# Patient Record
Sex: Male | Born: 1965 | Race: White | Hispanic: No | Marital: Married | Smoking: Never smoker
Health system: Southern US, Community
[De-identification: ages and names within clinical notes are randomized; demographics above are authoritative.]

## PROBLEM LIST (undated history)

## (undated) DIAGNOSIS — Z889 Allergy status to unspecified drugs, medicaments and biological substances status: Secondary | ICD-10-CM

## (undated) DIAGNOSIS — M109 Gout, unspecified: Secondary | ICD-10-CM

## (undated) DIAGNOSIS — K219 Gastro-esophageal reflux disease without esophagitis: Secondary | ICD-10-CM

## (undated) DIAGNOSIS — K5790 Diverticulosis of intestine, part unspecified, without perforation or abscess without bleeding: Secondary | ICD-10-CM

## (undated) DIAGNOSIS — M1990 Unspecified osteoarthritis, unspecified site: Secondary | ICD-10-CM

## (undated) DIAGNOSIS — J45909 Unspecified asthma, uncomplicated: Secondary | ICD-10-CM

## (undated) HISTORY — DX: Allergy status to unspecified drugs, medicaments and biological substances: Z88.9

## (undated) HISTORY — PX: KNEE SURGERY: SHX244

## (undated) HISTORY — DX: Gastro-esophageal reflux disease without esophagitis: K21.9

## (undated) HISTORY — DX: Diverticulosis of intestine, part unspecified, without perforation or abscess without bleeding: K57.90

## (undated) HISTORY — PX: SHOULDER SURGERY: SHX246

## (undated) HISTORY — DX: Gout, unspecified: M10.9

## (undated) HISTORY — PX: APPENDECTOMY: SHX54

## (undated) HISTORY — DX: Unspecified osteoarthritis, unspecified site: M19.90

## (undated) HISTORY — DX: Unspecified asthma, uncomplicated: J45.909

---

## 2005-07-09 ENCOUNTER — Ambulatory Visit: Payer: Self-pay | Admitting: Psychiatry

## 2006-05-27 ENCOUNTER — Ambulatory Visit (HOSPITAL_COMMUNITY): Payer: Self-pay | Admitting: Psychiatry

## 2014-07-10 ENCOUNTER — Encounter: Payer: Self-pay | Admitting: Physician Assistant

## 2014-07-25 ENCOUNTER — Encounter: Payer: Self-pay | Admitting: *Deleted

## 2014-07-26 ENCOUNTER — Ambulatory Visit (INDEPENDENT_AMBULATORY_CARE_PROVIDER_SITE_OTHER): Payer: PRIVATE HEALTH INSURANCE | Admitting: Physician Assistant

## 2014-07-26 ENCOUNTER — Other Ambulatory Visit (INDEPENDENT_AMBULATORY_CARE_PROVIDER_SITE_OTHER): Payer: PRIVATE HEALTH INSURANCE

## 2014-07-26 ENCOUNTER — Encounter: Payer: Self-pay | Admitting: Physician Assistant

## 2014-07-26 VITALS — BP 120/78 | HR 60 | Ht 67.0 in | Wt 208.4 lb

## 2014-07-26 DIAGNOSIS — R1314 Dysphagia, pharyngoesophageal phase: Secondary | ICD-10-CM

## 2014-07-26 DIAGNOSIS — Z8719 Personal history of other diseases of the digestive system: Secondary | ICD-10-CM

## 2014-07-26 DIAGNOSIS — Z8379 Family history of other diseases of the digestive system: Secondary | ICD-10-CM

## 2014-07-26 LAB — LIPID PANEL
CHOL/HDL RATIO: 4
CHOLESTEROL: 166 mg/dL (ref 0–200)
HDL: 37.6 mg/dL — ABNORMAL LOW (ref >39.00)
LDL Cholesterol: 103 mg/dL — ABNORMAL HIGH (ref 0–99)
NonHDL: 128.4
Triglycerides: 128 mg/dL (ref 0.0–149.0)
VLDL: 25.6 mg/dL (ref 0.0–40.0)

## 2014-07-26 MED ORDER — PANTOPRAZOLE SODIUM 40 MG PO TBEC: 40.0000 mg | DELAYED_RELEASE_TABLET | Freq: Every day | ORAL | Status: DC

## 2014-07-26 NOTE — Patient Instructions (Signed)
Please go to the basement level to have your labs drawn.  You have been scheduled for an endoscopy. Please follow written instructions given to you at your visit today. If you use inhalers (even only as needed), please bring them with you on the day of your procedure.

## 2014-07-26 NOTE — Progress Notes (Addendum)
Patient ID: Wesley Cordova, male   DOB: [birth year], 49 y.o.   MRN: [id number]   Subjective:    Patient ID: Wesley Cordova, male    DOB: September 11, 1965, 49 y.o.   MRN: [id number]  HPI Wesley Cordova is a pleasant 49 -year-old white male new to GI today referred by [facility] for evaluation of dysphagia. Patient has history of an esophageal stricture and states that he had 2 prior dilations done per Dr. Algis Greenhouse in [place]. We do have some of those records and it appears he had last EGD done in 2011 with finding of a distal esophageal stricture with his active erosions biopsies were done and he was dilated to #54. Path from those biopsies showed epithelial hyperplasia and mild chronic inflammation of the esophagus. Patient also had EGD in 2007 with dilation. He had upper abdominal ultrasound done in March 2011 which was negative with the exception of a nonobstructing left renal calculus. He also states that he had a prior colonoscopy. Plan 2003 year 2000 for by a Dr. Posey Pronto in [place] was found to have diverticulosis. Patient says that he has been having trouble again for the past couple of months with recurrent episodes of solid food dysphagia. This seems to be worse with meats. He has had episodes once or twice weekly with food hanging in his esophagus. He says this is very uncomfortable he has to stop eating get up and walk around and sometimes has his wife attempt the Heimlich maneuver. He has rarely had to regurgitate generally food will gradually pass. He is episodes he feels fine. He says he has never had any problems with heartburn or indigestion. Implants of abdominal discomfort. No changes in his bowel habits. His wife is quite concerned as he has a strong history family history of liver disease and fatty liver. Patient does not believe he is ever been tested for hepatitis C or had a lipid panel. He does not consume alcohol on a regular basis. Again upper abdominal ultrasound from 2011 was  read as a normal-appearing liver. Patient has not ever take an acid blockers as he has not been bothered by heartburn or indigestion.  Review of Systems Pertinent positive and negative review of systems were noted in the above HPI section.  All other review of systems was otherwise negative.  Outpatient Encounter Prescriptions as of 07/26/2014  Medication Sig  . colchicine 0.6 MG tablet Take 0.6 mg by mouth.  . sertraline (ZOLOFT) 100 MG tablet Take 100 mg by mouth daily.  . pantoprazole (PROTONIX) 40 MG tablet Take 1 tablet (40 mg total) by mouth daily.   No Known Allergies Patient Active Problem List   Diagnosis Date Noted  . History of esophageal stricture 07/26/2014   History   Social History  . Marital Status: Single    Spouse Name: N/A  . Number of Children: 1  . Years of Education: N/A   Occupational History  . Electrician    Social History Main Topics  . Smoking status: Never Smoker   . Smokeless tobacco: Never Used  . Alcohol Use: No     Comment: Social bases only  . Drug Use: No  . Sexual Activity: Not on file   Other Topics Concern  . Not on file   Social History Narrative    Mr. Wesley Cordova's family history includes Colon polyps in his mother; Diabetes in his mother; Diabetes Mellitus II in his mother; Heart disease in his father; Hypertension in his father; Liver disease  in his mother; Prostate cancer in his maternal uncle.      Objective:    Filed Vitals:   07/26/14 1015  BP: 120/78  Pulse: 60    Physical Exam  well-developed white male in no acute distress, pleasant accompanied by his wife blood pressure 120/78 pulse 60 height 5 foot 7 weight 208. HEENT; nontraumatic normocephalic EOMI PERRLA sclera anicteric, Supple; no JVD, Cardiovascular; regular rate and rhythm with S1-S2 no murmur rub or gallop, Pulmonary; clear bilaterally, Abdomen; soft nontender nondistended there is no palpable mass or hepatosplenomegaly BS sounds are present, Rectal; exam not  done, Extremities; no clubbing cyanosis or edema several tattoos, Psych ;mood and affect appropriate       Assessment & Plan:  #1 49 yo male with hx of esophageal stricture s/p previous dilations-last in 2011 . 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 Hilarie Fredrickson . Procedure discussed in detail with pt and he is agreeable to proceed Check lipid panel and hep C  AB for screening  [facility]-Dr.John Hungerland   Amy Genia Harold PA-C 07/26/2014  Addendum: Reviewed and agree with initial management. Jerene Bears, MD

## 2014-07-27 LAB — HEPATITIS C ANTIBODY: HCV Ab: NEGATIVE

## 2014-08-15 ENCOUNTER — Encounter: Payer: PRIVATE HEALTH INSURANCE | Admitting: Internal Medicine

## 2014-09-10 ENCOUNTER — Encounter: Payer: PRIVATE HEALTH INSURANCE | Admitting: Internal Medicine

## 2018-06-22 ENCOUNTER — Encounter: Payer: Self-pay | Admitting: Family Medicine

## 2018-12-18 ENCOUNTER — Ambulatory Visit (INDEPENDENT_AMBULATORY_CARE_PROVIDER_SITE_OTHER): Payer: BC Managed Care – PPO | Admitting: Internal Medicine

## 2018-12-21 ENCOUNTER — Ambulatory Visit (INDEPENDENT_AMBULATORY_CARE_PROVIDER_SITE_OTHER): Payer: BC Managed Care – PPO | Admitting: Internal Medicine

## 2018-12-21 ENCOUNTER — Other Ambulatory Visit: Payer: Self-pay

## 2018-12-21 ENCOUNTER — Telehealth (INDEPENDENT_AMBULATORY_CARE_PROVIDER_SITE_OTHER): Payer: Self-pay | Admitting: *Deleted

## 2018-12-21 ENCOUNTER — Encounter (INDEPENDENT_AMBULATORY_CARE_PROVIDER_SITE_OTHER): Payer: Self-pay | Admitting: *Deleted

## 2018-12-21 ENCOUNTER — Encounter (INDEPENDENT_AMBULATORY_CARE_PROVIDER_SITE_OTHER): Payer: Self-pay | Admitting: Internal Medicine

## 2018-12-21 VITALS — BP 126/76 | HR 53 | Temp 98.1°F | Resp 18 | Ht 68.0 in | Wt 201.0 lb

## 2018-12-21 DIAGNOSIS — Z1211 Encounter for screening for malignant neoplasm of colon: Secondary | ICD-10-CM

## 2018-12-21 DIAGNOSIS — K5732 Diverticulitis of large intestine without perforation or abscess without bleeding: Secondary | ICD-10-CM | POA: Insufficient documentation

## 2018-12-21 MED ORDER — PLENVU 140 G PO SOLR: 1.0000 | Freq: Once | ORAL | 0 refills | Status: AC

## 2018-12-21 NOTE — Telephone Encounter (Signed)
Patient needs plenvu 

## 2018-12-21 NOTE — Progress Notes (Addendum)
Subjective:    Patient ID: Wesley Cordova, male    DOB: 29-Dec-1965, 53 y.o.   MRN: [id number]  HPI  Wesley Cordova is a 53 y.o. male with a history of dysphagia, esophageal stricture and diverticulitis. He presents today for further evaluation for recurrent diverticulitis. He awakened at 4am with nausea, vomiting and generalized lower abdominal pain with diarrhea approximately 3 weeks ago. He had burning chest pain after vomiting. He went to [facility] ED in [place]. He received Protonix and his chest pain abated. Cardiac evaluation was negative. An abdominal/pelvic CT confirmed diverticulitis per the patient's report. He stated having right and left lower abdominal pain at that time, R > L. He was discharged home on Cipro 500mg  po bid an Flagyl 10mg  po bid x 10 days. He was not given a prescription for these medications as he had a current supply at home. His abdominal pain completely resolved after the 3rd day on antibiotics. No further diarrhea. No constipation. He typically passes a normal solid stool once daily. No rectal bleeding or melena. He reports having approximately 12 episodes of diverticulitis over the past 7 to 8 years. He was admitted to the hospital in [place], [place] for 3 days with diverticulitis 12/2017, no abscess reported. He received IV antibiotics for 3 days then discharged home on po Cipro and Flagyl and his pain completely resolved. He's never seen a surgeon to evaluate his recurrent diverticulitis. He took Protonix 40mg  po once daily for 2 weeks following his [facility] ED evaluation 10/2018 without further heartburn or chest pain. No dysphagia since his esophagus was dilated in 2011. He infrequently takes Ibuprofen for aches and pains, last took  Ibuprofen 200mg  4 tabs for a headache 1 month ago. He drinks 1 or 2 beers weekly. Mother with history of colon polyps and NASH cirrhosis which required a liver transplant 3 years ago.  EGD 08/21/2009 by Dr. Nathaneil Canary Shiflett: Peptic  stricture with active erosions distally at 40cm. Stomach, pyloric channel and duodenum appeared normal. The esophagus was dilated with a 54 Bougie x 3 to fully dilate the esophagus.  Path from those biopsies showed epithelial hyperplasia and mild chronic inflammation of the esophagus.   EGD in 2007 with esophageal dilation  2003 or 2004:Colonoscopy by Dr. Posey Pronto in [place], [place]:  Diverticulosis  Past Medical History:  Diagnosis Date  . Arthritis   . Asthma   . Diverticulosis   . GERD (gastroesophageal reflux disease)   . Gout   . Multiple allergies    Past Surgical History:  Procedure Laterality Date  . APPENDECTOMY    . KNEE SURGERY    . SHOULDER SURGERY      Review of Systems: See HPI, all other systems reviewed and are negative.   Current Outpatient Medications on File Prior to Visit  Medication Sig Dispense Refill  . sertraline (ZOLOFT) 100 MG tablet Take 100 mg by mouth daily.     No current facility-administered medications on file prior to visit.       Objective:   Physical Exam BP 126/76   Pulse (!) 53   Temp 98.1 F (36.7 C)   Resp 18   Ht 5\' 8"  (1.727 m)   Wt 201 lb (91.2 kg)   BMI 30.56 kg/m   Head: normocephalic Eyes: sclera nonicteric, conjunctiva pink Neck: supple, no thyromegaly, no lymphadenopathy Heart: S1,S2, RRR, no murmurs Lungs: clear throughout lung fields. Abdomen: soft, nontender, no masses or organomegaly.  Extremities: no edema or cyanosis. Skin:  multiple tattoos Neuro: alert and oriented x 4, no focal deficits     Assessment & Plan:   1. 53 y.o. male with recurrent diverticulitis -request abd/pelvic CT report and lab results from [facility] 10/2018 -schedule a colonoscopy in 8 to 10 weeks, benefits and risks discussed including risk with sedation, risk of bleeding and perforation -patient to call office if abdominal pain recurs -avoid constipation discussed  2. Family history of colon polyps  3. Family history of NASH -avoid  weight gain, discussed exercise, healthy diet  4. History of dysphagia secondary to esophageal stricture, no further dysphagia since esophageal dilatation  In 2011 -patient will call office if dysphagia or heartburn recurs  Addendum 12/26/2018: Patient's records from [facility] received. An abdominal/pelvic CT with IV contrast 11/14/2018 identified diverticulosis WITHOUT EVIDENCE OF DIVERTICULITIS. Reports to be scanned into chart.  WBC 10.8. He was given Protonix and Reglan for his chest pain. Patient should proceed with a colonoscopy as scheduled as his last colonoscopy was ? In 2004.  [place]

## 2018-12-21 NOTE — Patient Instructions (Signed)
1. Schedule a colonoscopy in 8 to 10 weeks  2. Call our office if your abdominal pain recurs prior to your colonoscopy  3. Further follow up to be determined after colonoscopy completed

## 2019-02-20 ENCOUNTER — Other Ambulatory Visit (HOSPITAL_COMMUNITY)
Admission: RE | Admit: 2019-02-20 | Discharge: 2019-02-20 | Disposition: A | Discharge status: Home / Self Care | Payer: BC Managed Care – PPO | Source: Ambulatory Visit | Attending: Internal Medicine | Admitting: Internal Medicine

## 2019-02-20 DIAGNOSIS — Z20828 Contact with and (suspected) exposure to other viral communicable diseases: Secondary | ICD-10-CM | POA: Diagnosis not present

## 2019-02-20 DIAGNOSIS — Z01812 Encounter for preprocedural laboratory examination: Secondary | ICD-10-CM | POA: Diagnosis present

## 2019-02-20 LAB — SARS CORONAVIRUS 2 (TAT 6-24 HRS): SARS Coronavirus 2: NEGATIVE

## 2019-02-22 ENCOUNTER — Encounter (HOSPITAL_COMMUNITY): Payer: Self-pay | Admitting: *Deleted

## 2019-02-22 ENCOUNTER — Other Ambulatory Visit: Payer: Self-pay

## 2019-02-22 ENCOUNTER — Ambulatory Visit (HOSPITAL_COMMUNITY)
Admission: RE | Admit: 2019-02-22 | Discharge: 2019-02-22 | Disposition: A | Discharge status: Home / Self Care | Payer: BC Managed Care – PPO | Attending: Internal Medicine | Admitting: Internal Medicine

## 2019-02-22 ENCOUNTER — Encounter (HOSPITAL_COMMUNITY)
Admission: RE | Disposition: A | Discharge status: Home / Self Care | Payer: Self-pay | Source: Home / Self Care | Attending: Internal Medicine

## 2019-02-22 DIAGNOSIS — D120 Benign neoplasm of cecum: Secondary | ICD-10-CM | POA: Diagnosis not present

## 2019-02-22 DIAGNOSIS — K644 Residual hemorrhoidal skin tags: Secondary | ICD-10-CM | POA: Diagnosis not present

## 2019-02-22 DIAGNOSIS — D122 Benign neoplasm of ascending colon: Secondary | ICD-10-CM | POA: Diagnosis not present

## 2019-02-22 DIAGNOSIS — Z1211 Encounter for screening for malignant neoplasm of colon: Secondary | ICD-10-CM | POA: Insufficient documentation

## 2019-02-22 DIAGNOSIS — M1990 Unspecified osteoarthritis, unspecified site: Secondary | ICD-10-CM | POA: Diagnosis not present

## 2019-02-22 DIAGNOSIS — D123 Benign neoplasm of transverse colon: Secondary | ICD-10-CM

## 2019-02-22 DIAGNOSIS — K5730 Diverticulosis of large intestine without perforation or abscess without bleeding: Secondary | ICD-10-CM | POA: Insufficient documentation

## 2019-02-22 DIAGNOSIS — Z79899 Other long term (current) drug therapy: Secondary | ICD-10-CM | POA: Diagnosis not present

## 2019-02-22 DIAGNOSIS — K6289 Other specified diseases of anus and rectum: Secondary | ICD-10-CM | POA: Insufficient documentation

## 2019-02-22 DIAGNOSIS — J45909 Unspecified asthma, uncomplicated: Secondary | ICD-10-CM | POA: Diagnosis not present

## 2019-02-22 DIAGNOSIS — K5732 Diverticulitis of large intestine without perforation or abscess without bleeding: Secondary | ICD-10-CM | POA: Insufficient documentation

## 2019-02-22 HISTORY — PX: POLYPECTOMY: SHX5525

## 2019-02-22 HISTORY — PX: COLONOSCOPY: SHX5424

## 2019-02-22 SURGERY — COLONOSCOPY
Anesthesia: Moderate Sedation

## 2019-02-22 MED ORDER — MIDAZOLAM HCL 5 MG/5ML IJ SOLN
INTRAMUSCULAR | Status: AC
  Filled 2019-02-22: qty 10

## 2019-02-22 MED ORDER — MEPERIDINE HCL 50 MG/ML IJ SOLN
INTRAMUSCULAR | Status: DC | PRN
  Administered 2019-02-22 (×3): 25 mg via INTRAVENOUS

## 2019-02-22 MED ORDER — MEPERIDINE HCL 50 MG/ML IJ SOLN
INTRAMUSCULAR | Status: AC
  Filled 2019-02-22: qty 1

## 2019-02-22 MED ORDER — STERILE WATER FOR IRRIGATION IR SOLN
Status: DC | PRN
  Administered 2019-02-22: 2.5 mL

## 2019-02-22 MED ORDER — SODIUM CHLORIDE 0.9 % IV SOLN
INTRAVENOUS | Status: DC
  Administered 2019-02-22: 10:00:00 via INTRAVENOUS

## 2019-02-22 MED ORDER — MIDAZOLAM HCL 5 MG/5ML IJ SOLN
INTRAMUSCULAR | Status: DC | PRN
  Administered 2019-02-22 (×4): 2 mg via INTRAVENOUS
  Administered 2019-02-22: 1 mg via INTRAVENOUS

## 2019-02-22 NOTE — Op Note (Signed)
[facility] Patient Name: Wesley Cordova Procedure Date: 02/22/2019 10:00 AM MRN: [id number] Date of Birth: [birth year] Attending MD: Hildred Laser , MD CSN: [id number] Age: 53 Admit Type: Outpatient Procedure:                Colonoscopy Indications:              Screening for colorectal malignant neoplasm Providers:                Hildred Laser, MD, Otis Peak B. Sharon Seller, RN, Aram Candela Referring MD:             Yvone Neu, MD Medicines:                Meperidine 75 mg IV, Midazolam 9 mg IV Complications:            No immediate complications. Estimated Blood Loss:     Estimated blood loss was minimal. Procedure:                Pre-Anesthesia Assessment:                           - Prior to the procedure, a History and Physical                            was performed, and patient medications and                            allergies were reviewed. The patient's tolerance of                            previous anesthesia was also reviewed. The risks                            and benefits of the procedure and the sedation                            options and risks were discussed with the patient.                            All questions were answered, and informed consent                            was obtained. Prior Anticoagulants: The patient has                            taken no previous anticoagulant or antiplatelet                            agents except for NSAID medication. ASA Grade                            Assessment: II - A patient with mild systemic  disease. After reviewing the risks and benefits,                            the patient was deemed in satisfactory condition to                            undergo the procedure.                           After obtaining informed consent, the colonoscope                            was passed under direct vision. Throughout the   procedure, the patient's blood pressure, pulse, and                            oxygen saturations were monitored continuously. The                            [id number] scope was introduced through                            the anus and advanced to the the cecum, identified                            by appendiceal orifice and ileocecal valve. The                            colonoscopy was performed without difficulty. The                            patient tolerated the procedure well. The quality                            of the bowel preparation was good. The ileocecal                            valve, appendiceal orifice, and rectum were                            photographed. Scope In: 10:36:03 AM Scope Out: 11:05:25 AM Scope Withdrawal Time: 0 hours 23 minutes 16 seconds  Total Procedure Duration: 0 hours 29 minutes 22 seconds  Findings:      The perianal and digital rectal examinations were normal.      A 4 to 7 mm polyp was found in the cecum. The polyp was flat. The polyp       was removed with a cold snare. Resection and retrieval were complete.       The pathology specimen was placed into Bottle Number 1.      Three polyps were found in the ascending colon. The polyps were small in       size. These were biopsied with a cold forceps for histology. The       pathology specimen was placed into Bottle Number 1.      Scattered small and large-mouthed  diverticula were found in the sigmoid       colon.      External hemorrhoids were found during retroflexion. The hemorrhoids       were small.      Anal papilla(e) were hypertrophied. Impression:               - One 4 to 7 mm polyp in the cecum, removed with a                            cold snare. Resected and retrieved.                           - Three small polyps in the ascending colon.                            Biopsied.                           - Diverticulosis in the sigmoid colon.                           -  External hemorrhoids.                           - Anal papilla(e) were hypertrophied. Moderate Sedation:      Moderate (conscious) sedation was administered by the endoscopy nurse       and supervised by the endoscopist. The following parameters were       monitored: oxygen saturation, heart rate, blood pressure, CO2       capnography and response to care. Total physician intraservice time was       38 minutes. Recommendation:           - Patient has a contact number available for                            emergencies. The signs and symptoms of potential                            delayed complications were discussed with the                            patient. Return to normal activities tomorrow.                            Written discharge instructions were provided to the                            patient.                           - High fiber diet today.                           - Continue present medications.                           - No aspirin, ibuprofen, naproxen,  or other                            non-steroidal anti-inflammatory drugs for 1 day.                           - Await pathology results.                           - Repeat colonoscopy is recommended for                            surveillance. The colonoscopy date will be                            determined after pathology results from today's                            exam become available for review. Procedure Code(s):        --- Professional ---                           [phone], Colonoscopy, flexible; with removal of                            tumor(s), polyp(s), or other lesion(s) by snare                            technique                           [id number], 59, Colonoscopy, flexible; with biopsy,                            single or multiple                           99153, Moderate sedation; each additional 15                            minutes intraservice time                           99153,  Moderate sedation; each additional 15                            minutes intraservice time                           G0500, Moderate sedation services provided by the                            same physician or other qualified health care                            professional performing a gastrointestinal  endoscopic service that sedation supports,                            requiring the presence of an independent trained                            observer to assist in the monitoring of the                            patient's level of consciousness and physiological                            status; initial 15 minutes of intra-service time;                            patient age 38 years or older (additional time may                            be reported with [phone], as appropriate) Diagnosis Code(s):        --- Professional ---                           K63.5, Polyp of colon                           Z12.11, Encounter for screening for malignant                            neoplasm of colon                           K64.4, Residual hemorrhoidal skin tags                           K62.89, Other specified diseases of anus and rectum                           K57.30, Diverticulosis of large intestine without                            perforation or abscess without bleeding CPT copyright 2019 American Medical Association. All rights reserved. The codes documented in this report are preliminary and upon coder review may  be revised to meet current compliance requirements. Hildred Laser, MD Hildred Laser, MD 02/22/2019 11:17:09 AM This report has been signed electronically. Number of Addenda: 0

## 2019-02-22 NOTE — H&P (Signed)
Wesley Cordova is an 53 y.o. male.   Chief Complaint: Patient is here for colonoscopy. HPI: Patient is 53 year old male who is here for colonoscopy primarily for screening purposes.  His last colonoscopy was over 10 years ago.  He has a history of diverticulitis.  Last bad episode was in fall last year when he was hospitalized for 3 days.  He generally has antibiotics with him just in case.  His bowels are regular.  He denies melena or rectal bleeding.  His appetite is good and his weight has been stable.  He takes OTC NSAIDs on as-needed basis. Family history is negative for CRC.  Past Medical History:  Diagnosis Date  . Arthritis   . Asthma   . Diverticulosis   . GERD (gastroesophageal reflux disease)   . Gout   . Multiple allergies     Past Surgical History:  Procedure Laterality Date  . APPENDECTOMY    . KNEE SURGERY    . SHOULDER SURGERY      Family History  Problem Relation Age of Onset  . Hypertension Father   . Heart disease Father   . Diabetes Mellitus II Mother   . Colon polyps Mother   . Diabetes Mother   . Liver disease Mother   . Prostate cancer Maternal Uncle    Social History:  reports that he has never smoked. He has never used smokeless tobacco. He reports that he does not drink alcohol or use drugs.  Allergies: No Known Allergies  Medications Prior to Admission  Medication Sig Dispense Refill  . ibuprofen (ADVIL) 200 MG tablet Take 800 mg by mouth every 8 (eight) hours as needed (pain.).    Marland Kitchen NON FORMULARY Take 1 Scoop by mouth daily as needed (prior to workout regime). PRE WORKOUT    . sertraline (ZOLOFT) 100 MG tablet Take 100 mg by mouth every evening.     . naproxen sodium (ALEVE) 220 MG tablet Take 440 mg by mouth 2 (two) times daily as needed (pain.).      No results found for this or any previous visit (from the past 48 hour(s)). No results found.  ROS  Blood pressure 135/76, pulse (!) 52, temperature 97.9 F (36.6 C), temperature source Oral,  resp. rate 12, SpO2 100 %. Physical Exam  Constitutional: He appears well-developed and well-nourished.  HENT:  Mouth/Throat: Oropharynx is clear and moist.  Eyes: Conjunctivae are normal. No scleral icterus.  Neck: No thyromegaly present.  Cardiovascular: Normal rate, regular rhythm and normal heart sounds.  No murmur heard. Respiratory: Effort normal and breath sounds normal.  GI: Soft. Bowel sounds are normal. He exhibits no distension and no mass. There is no abdominal tenderness.  Musculoskeletal:        General: No edema.  Lymphadenopathy:    He has no cervical adenopathy.  Neurological: He is alert.  Skin: Skin is warm and dry.     Assessment/Plan Average risk screening colonoscopy. He has history of recurrent diverticulitis for which she has fully recovered.  This is not the reason for his colonoscopy.  Hildred Laser, MD 02/22/2019, 10:22 AM

## 2019-02-22 NOTE — Discharge Instructions (Signed)
No aspirin ibuprofen or other NSAIDs for 24 hours. Ibuprofen on as-needed basis.  Take 400 or 600 mg rather than 800 mg each time. Resume other medications as before. High-fiber diet. No driving for 24 hours. Physician will call with biopsy results.   Colonoscopy, Adult, Care After This sheet gives you information about how to care for yourself after your procedure. Your doctor may also give you more specific instructions. If you have problems or questions, call your doctor. What can I expect after the procedure? After the procedure, it is common to have:  A small amount of blood in your poop for 24 hours.  Some gas.  Mild cramping or bloating in your belly. Follow these instructions at home: General instructions  For the first 24 hours after the procedure: ? Do not drive or use machinery. ? Do not sign important documents. ? Do not drink alcohol. ? Do your daily activities more slowly than normal. ? Eat foods that are soft and easy to digest.  Take over-the-counter or prescription medicines only as told by your doctor. To help cramping and bloating:   Try walking around.  Put heat on your belly (abdomen) as told by your doctor. Use a heat source that your doctor recommends, such as a moist heat pack or a heating pad. ? Put a towel between your skin and the heat source. ? Leave the heat on for 20-30 minutes. ? Remove the heat if your skin turns bright red. This is especially important if you cannot feel pain, heat, or cold. You can get burned. Eating and drinking   Drink enough fluid to keep your pee (urine) clear or pale yellow.  Return to your normal diet as told by your doctor. Avoid heavy or fried foods that are hard to digest.  Avoid drinking alcohol for as long as told by your doctor. Contact a doctor if:  You have blood in your poop (stool) 2-3 days after the procedure. Get help right away if:  You have more than a small amount of blood in your poop.  You  see large clumps of tissue (blood clots) in your poop.  Your belly is swollen.  You feel sick to your stomach (nauseous).  You throw up (vomit).  You have a fever.  You have belly pain that gets worse, and medicine does not help your pain. Summary  After the procedure, it is common to have a small amount of blood in your poop. You may also have mild cramping and bloating in your belly.  For the first 24 hours after the procedure, do not drive or use machinery, do not sign important documents, and do not drink alcohol.  Get help right away if you have a lot of blood in your poop, feel sick to your stomach, have a fever, or have more belly pain. This information is not intended to replace advice given to you by your health care provider. Make sure you discuss any questions you have with your health care provider. Document Released: 06/19/2010 Document Revised: 03/17/2017 Document Reviewed: 02/09/2016 Elsevier Patient Education  2020 [place].  Colon Polyps  Polyps are tissue growths inside the body. Polyps can grow in many places, including the large intestine (colon). A polyp may be a round bump or a mushroom-shaped growth. You could have one polyp or several. Most colon polyps are noncancerous (benign). However, some colon polyps can become cancerous over time. Finding and removing the polyps early can help prevent this. What are the  causes? The exact cause of colon polyps is not known. What increases the risk? You are more likely to develop this condition if you:  Have a family history of colon cancer or colon polyps.  Are older than 40 or older than 45 if you are African American.  Have inflammatory bowel disease, such as ulcerative colitis or Crohn's disease.  Have certain hereditary conditions, such as: ? Familial adenomatous polyposis. ? Lynch syndrome. ? Turcot syndrome. ? Peutz-Jeghers syndrome.  Are overweight.  Smoke cigarettes.  Do not get enough  exercise.  Drink too much alcohol.  Eat a diet that is high in fat and red meat and low in fiber.  Had childhood cancer that was treated with abdominal radiation. What are the signs or symptoms? Most polyps do not cause symptoms. If you have symptoms, they may include:  Blood coming from your rectum when having a bowel movement.  Blood in your stool. The stool may look dark red or black.  Abdominal pain.  A change in bowel habits, such as constipation or diarrhea. How is this diagnosed? This condition is diagnosed with a colonoscopy. This is a procedure in which a lighted, flexible scope is inserted into the anus and then passed into the colon to examine the area. Polyps are sometimes found when a colonoscopy is done as part of routine cancer screening tests. How is this treated? Treatment for this condition involves removing any polyps that are found. Most polyps can be removed during a colonoscopy. Those polyps will then be tested for cancer. Additional treatment may be needed depending on the results of testing. Follow these instructions at home: Lifestyle  Maintain a healthy weight, or lose weight if recommended by your health care provider.  Exercise every day or as told by your health care provider.  Do not use any products that contain nicotine or tobacco, such as cigarettes and e-cigarettes. If you need help quitting, ask your health care provider.  If you drink alcohol, limit how much you have: ? 0-1 drink a day for women. ? 0-2 drinks a day for men.  Be aware of how much alcohol is in your drink. In the [place] one drink equals one 12 oz bottle of beer (355 mL), one 5 oz glass of wine (148 mL), or one 1 oz shot of hard liquor (44 mL). Eating and drinking   Eat foods that are high in fiber, such as fruits, vegetables, and whole grains.  Eat foods that are high in calcium and vitamin D, such as milk, cheese, yogurt, eggs, liver, fish, and broccoli.  Limit foods that  are high in fat, such as fried foods and desserts.  Limit the amount of red meat and processed meat you eat, such as hot dogs, sausage, bacon, and lunch meats. General instructions  Keep all follow-up visits as told by your health care provider. This is important. ? This includes having regularly scheduled colonoscopies. ? Talk to your health care provider about when you need a colonoscopy. Contact a health care provider if:  You have new or worsening bleeding during a bowel movement.  You have new or increased blood in your stool.  You have a change in bowel habits.  You lose weight for no known reason. Summary  Polyps are tissue growths inside the body. Polyps can grow in many places, including the colon.  Most colon polyps are noncancerous (benign), but some can become cancerous over time.  This condition is diagnosed with a colonoscopy.  Treatment  for this condition involves removing any polyps that are found. Most polyps can be removed during a colonoscopy. This information is not intended to replace advice given to you by your health care provider. Make sure you discuss any questions you have with your health care provider. Document Released: 02/11/2004 Document Revised: 09/01/2017 Document Reviewed: 09/01/2017 Elsevier Patient Education  [place].  High-Fiber Diet Fiber, also called dietary fiber, is a type of carbohydrate that is found in fruits, vegetables, whole grains, and beans. A high-fiber diet can have many health benefits. Your health care provider may recommend a high-fiber diet to help:  Prevent constipation. Fiber can make your bowel movements more regular.  Lower your cholesterol.  Relieve the following conditions: ? Swelling of veins in the anus (hemorrhoids). ? Swelling and irritation (inflammation) of specific areas of the digestive tract (uncomplicated diverticulosis). ? A problem of the large intestine (colon) that sometimes causes pain and  diarrhea (irritable bowel syndrome, IBS).  Prevent overeating as part of a weight-loss plan.  Prevent heart disease, type 2 diabetes, and certain cancers. What is my plan? The recommended daily fiber intake in grams (g) includes:  38 g for men age 75 or younger.  30 g for men over age 28.  58 g for women age 62 or younger.  21 g for women over age 34. You can get the recommended daily intake of dietary fiber by:  Eating a variety of fruits, vegetables, grains, and beans.  Taking a fiber supplement, if it is not possible to get enough fiber through your diet. What do I need to know about a high-fiber diet?  It is better to get fiber through food sources rather than from fiber supplements. There is not a lot of research about how effective supplements are.  Always check the fiber content on the nutrition facts label of any prepackaged food. Look for foods that contain 5 g of fiber or more per serving.  Talk with a diet and nutrition specialist (dietitian) if you have questions about specific foods that are recommended or not recommended for your medical condition, especially if those foods are not listed below.  Gradually increase how much fiber you consume. If you increase your intake of dietary fiber too quickly, you may have bloating, cramping, or gas.  Drink plenty of water. Water helps you to digest fiber. What are tips for following this plan?  Eat a wide variety of high-fiber foods.  Make sure that half of the grains that you eat each day are whole grains.  Eat breads and cereals that are made with whole-grain flour instead of refined flour or white flour.  Eat brown rice, bulgur wheat, or millet instead of white rice.  Start the day with a breakfast that is high in fiber, such as a cereal that contains 5 g of fiber or more per serving.  Use beans in place of meat in soups, salads, and pasta dishes.  Eat high-fiber snacks, such as berries, raw vegetables, nuts, and  popcorn.  Choose whole fruits and vegetables instead of processed forms like juice or sauce. What foods can I eat?  Fruits Berries. Pears. Apples. Oranges. Avocado. Prunes and raisins. Dried figs. Vegetables Sweet potatoes. Spinach. Kale. Artichokes. Cabbage. Broccoli. Cauliflower. Green peas. Carrots. Squash. Grains Whole-grain breads. Multigrain cereal. Oats and oatmeal. Brown rice. Barley. Bulgur wheat. [place]. Quinoa. Bran muffins. Popcorn. Rye wafer crackers. Meats and other proteins Navy, kidney, and pinto beans. Soybeans. Split peas. Lentils. Nuts and seeds. Dairy  Fiber-fortified yogurt. Beverages Fiber-fortified soy milk. Fiber-fortified orange juice. Other foods Fiber bars. The items listed above may not be a complete list of recommended foods and beverages. Contact a dietitian for more options. What foods are not recommended? Fruits Fruit juice. Cooked, strained fruit. Vegetables Fried potatoes. Canned vegetables. Well-cooked vegetables. Grains White bread. Pasta made with refined flour. White rice. Meats and other proteins Fatty cuts of meat. Fried chicken or fried fish. Dairy Milk. Yogurt. Cream cheese. Sour cream. Fats and oils Butters. Beverages Soft drinks. Other foods Cakes and pastries. The items listed above may not be a complete list of foods and beverages to avoid. Contact a dietitian for more information. Summary  Fiber is a type of carbohydrate. It is found in fruits, vegetables, whole grains, and beans.  There are many health benefits of eating a high-fiber diet, such as preventing constipation, lowering blood cholesterol, helping with weight loss, and reducing your risk of heart disease, diabetes, and certain cancers.  Gradually increase your intake of fiber. Increasing too fast can result in cramping, bloating, and gas. Drink plenty of water while you increase your fiber.  The best sources of fiber include whole fruits and vegetables, whole  grains, nuts, seeds, and beans. This information is not intended to replace advice given to you by your health care provider. Make sure you discuss any questions you have with your health care provider. Document Released: 05/17/2005 Document Revised: 03/21/2017 Document Reviewed: 03/21/2017 Elsevier Patient Education  2020 [employer].  Hemorrhoids Hemorrhoids are swollen veins that may develop:  In the butt (rectum). These are called internal hemorrhoids.  Around the opening of the butt (anus). These are called external hemorrhoids. Hemorrhoids can cause pain, itching, or bleeding. Most of the time, they do not cause serious problems. They usually get better with diet changes, lifestyle changes, and other home treatments. What are the causes? This condition may be caused by:  Having trouble pooping (constipation).  Pushing hard (straining) to poop.  Watery poop (diarrhea).  Pregnancy.  Being very overweight (obese).  Sitting for long periods of time.  Heavy lifting or other activity that causes you to strain.  Anal sex.  Riding a bike for a long period of time. What are the signs or symptoms? Symptoms of this condition include:  Pain.  Itching or soreness in the butt.  Bleeding from the butt.  Leaking poop.  Swelling in the area.  One or more lumps around the opening of your butt. How is this diagnosed? A doctor can often diagnose this condition by looking at the affected area. The doctor may also:  Do an exam that involves feeling the area with a gloved hand (digital rectal exam).  Examine the area inside your butt using a small tube (anoscope).  Order blood tests. This may be done if you have lost a lot of blood.  Have you get a test that involves looking inside the colon using a flexible tube with a camera on the end (sigmoidoscopy or colonoscopy). How is this treated? This condition can usually be treated at home. Your doctor may tell you to change what  you eat, make lifestyle changes, or try home treatments. If these do not help, procedures can be done to remove the hemorrhoids or make them smaller. These may involve:  Placing rubber bands at the base of the hemorrhoids to cut off their blood supply.  Injecting medicine into the hemorrhoids to shrink them.  Shining a type of light energy onto the hemorrhoids  to cause them to fall off.  Doing surgery to remove the hemorrhoids or cut off their blood supply. Follow these instructions at home: Eating and drinking   Eat foods that have a lot of fiber in them. These include whole grains, beans, nuts, fruits, and vegetables.  Ask your doctor about taking products that have added fiber (fibersupplements).  Reduce the amount of fat in your diet. You can do this by: ? Eating low-fat dairy products. ? Eating less red meat. ? Avoiding processed foods.  Drink enough fluid to keep your pee (urine) pale yellow. Managing pain and swelling   Take a warm-water bath (sitz bath) for 20 minutes to ease pain. Do this 3-4 times a day. You may do this in a bathtub or using a portable sitz bath that fits over the toilet.  If told, put ice on the painful area. It may be helpful to use ice between your warm baths. ? Put ice in a plastic bag. ? Place a towel between your skin and the bag. ? Leave the ice on for 20 minutes, 2-3 times a day. General instructions  Take over-the-counter and prescription medicines only as told by your doctor. ? Medicated creams and medicines may be used as told.  Exercise often. Ask your doctor how much and what kind of exercise is best for you.  Go to the bathroom when you have the urge to poop. Do not wait.  Avoid pushing too hard when you poop.  Keep your butt dry and clean. Use wet toilet paper or moist towelettes after pooping.  Do not sit on the toilet for a long time.  Keep all follow-up visits as told by your doctor. This is important. Contact a doctor if  you:  Have pain and swelling that do not get better with treatment or medicine.  Have trouble pooping.  Cannot poop.  Have pain or swelling outside the area of the hemorrhoids. Get help right away if you have:  Bleeding that will not stop. Summary  Hemorrhoids are swollen veins in the butt or around the opening of the butt.  They can cause pain, itching, or bleeding.  Eat foods that have a lot of fiber in them. These include whole grains, beans, nuts, fruits, and vegetables.  Take a warm-water bath (sitz bath) for 20 minutes to ease pain. Do this 3-4 times a day. This information is not intended to replace advice given to you by your health care provider. Make sure you discuss any questions you have with your health care provider. Document Released: 02/24/2008 Document Revised: 05/25/2018 Document Reviewed: 10/06/2017 Elsevier Patient Education  2020 [employer].

## 2019-02-23 LAB — SURGICAL PATHOLOGY

## 2019-02-27 ENCOUNTER — Encounter (HOSPITAL_COMMUNITY): Payer: Self-pay | Admitting: Internal Medicine

## 2019-05-23 ENCOUNTER — Encounter (INDEPENDENT_AMBULATORY_CARE_PROVIDER_SITE_OTHER): Payer: Self-pay | Admitting: *Deleted

## 2019-05-23 ENCOUNTER — Encounter (INDEPENDENT_AMBULATORY_CARE_PROVIDER_SITE_OTHER): Payer: Self-pay | Admitting: Nurse Practitioner

## 2019-05-23 ENCOUNTER — Ambulatory Visit (INDEPENDENT_AMBULATORY_CARE_PROVIDER_SITE_OTHER): Payer: BC Managed Care – PPO | Admitting: Nurse Practitioner

## 2019-05-23 ENCOUNTER — Other Ambulatory Visit: Payer: Self-pay

## 2019-05-23 VITALS — BP 124/85 | HR 65 | Temp 97.7°F | Ht 68.0 in | Wt 193.9 lb

## 2019-05-23 DIAGNOSIS — R634 Abnormal weight loss: Secondary | ICD-10-CM | POA: Insufficient documentation

## 2019-05-23 DIAGNOSIS — K920 Hematemesis: Secondary | ICD-10-CM | POA: Insufficient documentation

## 2019-05-23 DIAGNOSIS — K219 Gastro-esophageal reflux disease without esophagitis: Secondary | ICD-10-CM | POA: Diagnosis not present

## 2019-05-23 DIAGNOSIS — R1013 Epigastric pain: Secondary | ICD-10-CM

## 2019-05-23 MED ORDER — PANTOPRAZOLE SODIUM 40 MG PO TBEC: 40.0000 mg | DELAYED_RELEASE_TABLET | Freq: Two times a day (BID) | ORAL | 3 refills | Status: DC

## 2019-05-23 NOTE — Progress Notes (Addendum)
Patient profile: Wesley Cordova is a 53 y.o. male seen for evaluation of hematemesis and melena, self referred. Last seen for colonoscopy September 2020  History of Present Illness: Wesley Cordova is seen today for acute symptoms that began approximately 10 days ago, began after exercising when developed issues with repetitive episodes of coffee-ground emesis, chest pain, diaphoresis, epigastric pain.  Reports he was seen in the ER in [place] and had an EKG with labs, required 4 doses of Dilaudid and was kept overnight.  He was discharged with 7-day course of Protonix 40 mg once a day which he completed.  4 days ago he had one episode of coffee-ground emesis and began having dark daily black bowel movements as well which have continued until today.  Denies any iron or Pepto use.  He has a soreness in his epigastric area that can become a stabbing pain at times.  He does have some GERD symptoms as well.  He is not currently on any form of PPI over the past couple of days since completing script ER provided for protonix.  He denies any dysphagia.  He does report a poor appetite and weight is down about 7 pounds over the past couple months.  Reports unintentional weight loss as he is now eating 1 meal a day but used to have a great appetite.   Denies constipation or diarrhea-reports black stools are soft, formed consistency usually once a day.  No obvious rectal bleeding.  Use Excedrin approximately twice a month.  Has approximately 4 beers a week.  Non-smoker.  Denies any history of GI bleed or similar symptoms    Wt Readings from Last 3 Encounters:  05/23/19 193 lb 14.4 oz (88 kg)  12/21/18 201 lb (91.2 kg)  07/26/14 208 lb 6.4 oz (94.5 kg)     Last Colonoscopy: 01/2019-4-7 mm polyp cecum, 3 polyps ascending colon, diverticulosis sigmoid, external hemorrhoids, hypertrophied anal papilla.  Pathology tubular adenoma, repeat colonoscopy recommended in 5 years   Last Endoscopy: 2011 -peptic  stricture with active erosions, dilation 46 [place]   Past Medical History:  Past Medical History:  Diagnosis Date  . Arthritis   . Asthma   . Diverticulosis   . GERD (gastroesophageal reflux disease)   . Gout   . Multiple allergies     Problem List: Patient Active Problem List   Diagnosis Date Noted  . Abdominal pain, epigastric 05/23/2019  . Hematemesis with nausea 05/23/2019  . Loss of weight 05/23/2019  . Diverticulitis of colon 12/21/2018  . Colon cancer screening 12/21/2018  . History of esophageal stricture 07/26/2014    Past Surgical History: Past Surgical History:  Procedure Laterality Date  . APPENDECTOMY    . COLONOSCOPY N/A 02/22/2019   Procedure: COLONOSCOPY;  Surgeon: Rogene Houston, MD;  Location: [facility];  Service: Endoscopy;  Laterality: N/A;  200  . KNEE SURGERY    . POLYPECTOMY  02/22/2019   Procedure: POLYPECTOMY;  Surgeon: Rogene Houston, MD;  Location: [facility];  Service: Endoscopy;;  colon  . SHOULDER SURGERY      Allergies: No Known Allergies    Home Medications:  Current Outpatient Medications:  .  sertraline (ZOLOFT) 100 MG tablet, Take 100 mg by mouth every evening. , Disp: , Rfl:  .  ibuprofen (ADVIL) 200 MG tablet, Take 3 tablets (600 mg total) by mouth every 8 (eight) hours as needed (pain.)., Disp: 30 tablet, Rfl: 0 .  NON FORMULARY, Take 1 Scoop by mouth  daily as needed (prior to workout regime). PRE WORKOUT, Disp: , Rfl:  .  pantoprazole (PROTONIX) 40 MG tablet, Take 1 tablet (40 mg total) by mouth 2 (two) times daily. Take 30 min before meals, Disp: 60 tablet, Rfl: 3   Family History: family history includes Colon polyps in his mother; Diabetes in his mother; Diabetes Mellitus II in his mother; Heart disease in his father; Hypertension in his father; Liver disease in his mother; Prostate cancer in his maternal uncle.    Social History:   reports that he has never smoked. He has never used smokeless tobacco. He reports  that he does not drink alcohol or use drugs.   Review of Systems: Constitutional: Denies weight loss/weight gain  Eyes: No changes in vision. ENT: No oral lesions, sore throat.  GI: see HPI.  Heme/Lymph: No easy bruising.  CV: No chest pain.  GU: No hematuria.  Integumentary: No rashes.  Neuro: No headaches.  Psych: No depression/anxiety.  Endocrine: No heat/cold intolerance.  Allergic/Immunologic: No urticaria.  Resp: No cough, SOB.  Musculoskeletal: No joint swelling.    Physical Examination: BP 124/85 (BP Location: Right Arm, Patient Position: Sitting, Cuff Size: Large)   Pulse 65   Temp 97.7 F (36.5 C) (Oral)   Ht 5\' 8"  (1.727 m)   Wt 193 lb 14.4 oz (88 kg)   BMI 29.48 kg/m  Gen: NAD, alert and oriented x 4 HEENT: PEERLA, EOMI, Neck: supple, no JVD Chest: CTA bilaterally, no wheezes, crackles, or other adventitious sounds CV: RRR, no m/g/c/r Abd: soft, NT, ND, +BS in all four quadrants; no HSM, guarding, ridigity, or rebound tenderness Rectal exam-no stool in rectal vault, mucus was Hemoccult negative Ext: no edema, well perfused with 2+ pulses, Skin: no rash or lesions noted on observed skin Lymph: no noted LAD   Assessment/Plan: Wesley Cordova is a 53 y.o. male    Wesley Cordova was seen today for follow-up.  Diagnoses and all orders for this visit:  Abdominal pain, epigastric -     CBC -     CMP -     Lipase -     Procedural/ Surgical Case Request: ESOPHAGOGASTRODUODENOSCOPY (EGD); Standing -     Procedural/ Surgical Case Request: ESOPHAGOGASTRODUODENOSCOPY (EGD)  Hematemesis with nausea -     CBC -     CMP -     Lipase -     Procedural/ Surgical Case Request: ESOPHAGOGASTRODUODENOSCOPY (EGD); Standing -     Procedural/ Surgical Case Request: ESOPHAGOGASTRODUODENOSCOPY (EGD)  Loss of weight -     CBC -     CMP -     Lipase -     Procedural/ Surgical Case Request: ESOPHAGOGASTRODUODENOSCOPY (EGD); Standing -     Procedural/ Surgical Case Request:  ESOPHAGOGASTRODUODENOSCOPY (EGD)  Chronic GERD  Other orders -     pantoprazole (PROTONIX) 40 MG tablet; Take 1 tablet (40 mg total) by mouth 2 (two) times daily. Take 30 min before meals     1.  Epigastric pain/hematemesis/melena/weight loss-acute onset 10 days ago, he was evaluated in the ER and I have requested lab results. We will also repeat labs today given he has had symptoms over the past 10 days. He was Hemoccult negative on exam, alarm symptoms reviewed to go to ER with over holiday weekend.  Differential includes gastritis, peptic ulcer disease, etc.  We will have him restart his PPI.  He does need  endoscopy for evaluation of hematemesis and has some unintentional weight loss due to  poor appetite.  If EGD negative will consider imaging.  Patient denies CP, SOB, and use of blood thinners. I discussed the risks and benefits of procedure including bleeding, perforation, infection, missed lesions, medication reactions and possible hospitalization or surgery if complications. All questions answered.   2. Hx colon polyps - due 2025.   Addendum-received ER labs after visit, CBC normal with hemoglobin 13.8, creatinine 1.3 with normal BUN.  LFTs normal, amylase normal.  Lipase elevated 93 (13-76 normal range)  I personally performed the service, non-incident to. (WP)  Laurine Blazer, PA-C [facility]  I agree with the above assessment and plan  [facility] CRNP

## 2019-05-23 NOTE — Patient Instructions (Signed)
Bland diet as we discussed.  We are restarting the Protonix/pantoprazole, I sent this to your pharmacy.  Avoid alcohol and NSAID products.  We are checking labs today and schedule you for an upper endoscopy.  If you develop recurrent episodes of vomiting coffee-ground emesis over the weekend to ER.

## 2019-05-24 LAB — COMPLETE METABOLIC PANEL WITH GFR
AG Ratio: 1.9 (calc) (ref 1.0–2.5)
ALT: 17 U/L (ref 9–46)
AST: 18 U/L (ref 10–35)
Albumin: 4.2 g/dL (ref 3.6–5.1)
Alkaline phosphatase (APISO): 70 U/L (ref 35–144)
BUN: 10 mg/dL (ref 7–25)
CO2: 28 mmol/L (ref 20–32)
Calcium: 9.4 mg/dL (ref 8.6–10.3)
Chloride: 104 mmol/L (ref 98–110)
Creat: 1.24 mg/dL (ref 0.70–1.33)
GFR, Est African American: 77 mL/min/{1.73_m2} (ref >=60)
GFR, Est Non African American: 66 mL/min/{1.73_m2} (ref >=60)
Globulin: 2.2 g/dL (calc) (ref 1.9–3.7)
Glucose, Bld: 100 mg/dL — ABNORMAL HIGH (ref 65–99)
Potassium: 4.8 mmol/L (ref 3.5–5.3)
Sodium: 139 mmol/L (ref 135–146)
Total Bilirubin: 0.5 mg/dL (ref 0.2–1.2)
Total Protein: 6.4 g/dL (ref 6.1–8.1)

## 2019-05-24 LAB — CBC
HCT: 41.3 % (ref 38.5–50.0)
Hemoglobin: 14.5 g/dL (ref 13.2–17.1)
MCH: 31.9 pg (ref 27.0–33.0)
MCHC: 35.1 g/dL (ref 32.0–36.0)
MCV: 91 fL (ref 80.0–100.0)
MPV: 10.1 fL (ref 7.5–12.5)
Platelets: 278 10*3/uL (ref 140–400)
RBC: 4.54 10*6/uL (ref 4.20–5.80)
RDW: 12.5 % (ref 11.0–15.0)
WBC: 7.6 10*3/uL (ref 3.8–10.8)

## 2019-05-24 LAB — LIPASE: Lipase: 65 U/L — ABNORMAL HIGH (ref 7–60)

## 2019-05-29 ENCOUNTER — Other Ambulatory Visit: Payer: Self-pay

## 2019-05-29 ENCOUNTER — Other Ambulatory Visit (HOSPITAL_COMMUNITY)
Admission: RE | Admit: 2019-05-29 | Discharge: 2019-05-29 | Disposition: A | Discharge status: Home / Self Care | Payer: BC Managed Care – PPO | Source: Ambulatory Visit | Attending: Internal Medicine | Admitting: Internal Medicine

## 2019-05-29 DIAGNOSIS — Z01812 Encounter for preprocedural laboratory examination: Secondary | ICD-10-CM | POA: Diagnosis not present

## 2019-05-29 DIAGNOSIS — Z20828 Contact with and (suspected) exposure to other viral communicable diseases: Secondary | ICD-10-CM | POA: Diagnosis not present

## 2019-05-29 LAB — SARS CORONAVIRUS 2 (TAT 6-24 HRS): SARS Coronavirus 2: NEGATIVE

## 2019-05-31 ENCOUNTER — Other Ambulatory Visit: Payer: Self-pay

## 2019-05-31 ENCOUNTER — Ambulatory Visit (HOSPITAL_COMMUNITY)
Admission: RE | Admit: 2019-05-31 | Discharge: 2019-05-31 | Disposition: A | Discharge status: Home / Self Care | Payer: BC Managed Care – PPO | Attending: Internal Medicine | Admitting: Internal Medicine

## 2019-05-31 ENCOUNTER — Encounter (HOSPITAL_COMMUNITY): Payer: Self-pay | Admitting: Internal Medicine

## 2019-05-31 ENCOUNTER — Ambulatory Visit (HOSPITAL_COMMUNITY): Payer: BC Managed Care – PPO

## 2019-05-31 ENCOUNTER — Encounter (HOSPITAL_COMMUNITY)
Admission: RE | Disposition: A | Discharge status: Home / Self Care | Payer: Self-pay | Source: Home / Self Care | Attending: Internal Medicine

## 2019-05-31 DIAGNOSIS — Z79899 Other long term (current) drug therapy: Secondary | ICD-10-CM | POA: Insufficient documentation

## 2019-05-31 DIAGNOSIS — M1990 Unspecified osteoarthritis, unspecified site: Secondary | ICD-10-CM | POA: Diagnosis not present

## 2019-05-31 DIAGNOSIS — J45909 Unspecified asthma, uncomplicated: Secondary | ICD-10-CM | POA: Insufficient documentation

## 2019-05-31 DIAGNOSIS — R1013 Epigastric pain: Secondary | ICD-10-CM | POA: Insufficient documentation

## 2019-05-31 DIAGNOSIS — R112 Nausea with vomiting, unspecified: Secondary | ICD-10-CM

## 2019-05-31 DIAGNOSIS — K317 Polyp of stomach and duodenum: Secondary | ICD-10-CM | POA: Diagnosis not present

## 2019-05-31 DIAGNOSIS — K920 Hematemesis: Secondary | ICD-10-CM | POA: Diagnosis not present

## 2019-05-31 DIAGNOSIS — K449 Diaphragmatic hernia without obstruction or gangrene: Secondary | ICD-10-CM | POA: Insufficient documentation

## 2019-05-31 DIAGNOSIS — Z6830 Body mass index (BMI) 30.0-30.9, adult: Secondary | ICD-10-CM | POA: Insufficient documentation

## 2019-05-31 DIAGNOSIS — K2970 Gastritis, unspecified, without bleeding: Secondary | ICD-10-CM | POA: Diagnosis not present

## 2019-05-31 DIAGNOSIS — K219 Gastro-esophageal reflux disease without esophagitis: Secondary | ICD-10-CM | POA: Diagnosis not present

## 2019-05-31 DIAGNOSIS — R634 Abnormal weight loss: Secondary | ICD-10-CM | POA: Diagnosis not present

## 2019-05-31 DIAGNOSIS — K228 Other specified diseases of esophagus: Secondary | ICD-10-CM | POA: Diagnosis not present

## 2019-05-31 HISTORY — PX: BIOPSY: SHX5522

## 2019-05-31 HISTORY — PX: ESOPHAGOGASTRODUODENOSCOPY: SHX5428

## 2019-05-31 SURGERY — EGD (ESOPHAGOGASTRODUODENOSCOPY)
Anesthesia: Moderate Sedation

## 2019-05-31 MED ORDER — PANTOPRAZOLE SODIUM 40 MG PO TBEC: 40.0000 mg | DELAYED_RELEASE_TABLET | Freq: Every day | ORAL | 5 refills | Status: DC

## 2019-05-31 MED ORDER — MEPERIDINE HCL 50 MG/ML IJ SOLN
INTRAMUSCULAR | Status: AC
  Filled 2019-05-31: qty 1

## 2019-05-31 MED ORDER — MEPERIDINE HCL 50 MG/ML IJ SOLN
INTRAMUSCULAR | Status: DC | PRN
  Administered 2019-05-31 (×3): 25 mg via INTRAVENOUS

## 2019-05-31 MED ORDER — MIDAZOLAM HCL 5 MG/5ML IJ SOLN
INTRAMUSCULAR | Status: AC
  Filled 2019-05-31: qty 10

## 2019-05-31 MED ORDER — LIDOCAINE VISCOUS HCL 2 % MT SOLN
OROMUCOSAL | Status: AC
  Filled 2019-05-31: qty 15

## 2019-05-31 MED ORDER — LIDOCAINE VISCOUS HCL 2 % MT SOLN
OROMUCOSAL | Status: DC | PRN
  Administered 2019-05-31: 5 mL via OROMUCOSAL

## 2019-05-31 MED ORDER — STERILE WATER FOR IRRIGATION IR SOLN
Status: DC | PRN
  Administered 2019-05-31: 1.5 mL

## 2019-05-31 MED ORDER — MIDAZOLAM HCL 5 MG/5ML IJ SOLN
INTRAMUSCULAR | Status: DC | PRN
  Administered 2019-05-31 (×5): 2 mg via INTRAVENOUS

## 2019-05-31 MED ORDER — SODIUM CHLORIDE 0.9 % IV SOLN: INTRAVENOUS | Status: DC

## 2019-05-31 NOTE — Op Note (Signed)
[facility] Patient Name: Wesley Cordova Procedure Date: 05/31/2019 10:27 AM MRN: [id number] Date of Birth: 1966/01/08 Attending MD: Hildred Laser , MD CSN: [id number] Age: 53 Admit Type: Outpatient Procedure:                Upper GI endoscopy Indications:              Epigastric abdominal pain, Hematemesis, Nausea with                            vomiting, Weight loss Providers:                Hildred Laser, MD, Otis Peak B. Sharon Seller, RN, Raphael Gibney, Technician Referring MD:             Yvone Neu, MD Medicines:                Lidocaine spray, Meperidine 75 mg IV, Midazolam 10                            mg IV Complications:            No immediate complications. Estimated Blood Loss:     Estimated blood loss was minimal. Procedure:                Pre-Anesthesia Assessment:                           - Prior to the procedure, a History and Physical                            was performed, and patient medications and                            allergies were reviewed. The patient's tolerance of                            previous anesthesia was also reviewed. The risks                            and benefits of the procedure and the sedation                            options and risks were discussed with the patient.                            All questions were answered, and informed consent                            was obtained. Prior Anticoagulants: The patient has                            taken no previous anticoagulant or antiplatelet  agents. ASA Grade Assessment: II - A patient with                            mild systemic disease. After reviewing the risks                            and benefits, the patient was deemed in                            satisfactory condition to undergo the procedure.                           After obtaining informed consent, the endoscope was                            passed  under direct vision. Throughout the                            procedure, the patient's blood pressure, pulse, and                            oxygen saturations were monitored continuously. The                            [id number] scope was introduced through the                            mouth, and advanced to the second part of duodenum.                            The upper GI endoscopy was accomplished without                            difficulty. The patient tolerated the procedure                            well. Scope In: 11:21:22 AM Scope Out: 11:34:47 AM Total Procedure Duration: 0 hours 13 minutes 25 seconds  Findings:      The examined esophagus was normal.      The Z-line was irregular and was found 36 cm from the incisors. Biopsies       were taken with a cold forceps for histology. The pathology specimen was       placed into Bottle Number 3.      A 2 cm hiatal hernia was present.      A single 6 mm semi-sessile polyp with no bleeding and no stigmata of       recent bleeding was found in the gastric fundus. Biopsies were taken       with a cold forceps for histology. The pathology specimen was placed       into Bottle Number 2.      Localized mild inflammation characterized by congestion (edema),       erythema and granularity was found in the prepyloric region of the       stomach. Biopsies were taken with a  cold forceps for histology. The       pathology specimen was placed into Bottle Number 1.      The duodenal bulb and second portion of the duodenum were normal. Impression:               - Normal esophagus.                           - Z-line irregular, 36 cm from the incisors.                            Biopsied.                           - 2 cm hiatal hernia.                           - A single gastric polyp. Biopsied.                           - Gastritis. Biopsied.                           - Normal duodenal bulb and second portion of the                             duodenum. Moderate Sedation:      Moderate (conscious) sedation was administered by the endoscopy nurse       and supervised by the endoscopist. The following parameters were       monitored: oxygen saturation, heart rate, blood pressure, CO2       capnography and response to care. Total physician intraservice time was       23 minutes. Recommendation:           - Patient has a contact number available for                            emergencies. The signs and symptoms of potential                            delayed complications were discussed with the                            patient. Return to normal activities tomorrow.                            Written discharge instructions were provided to the                            patient.                           - Resume previous diet today.                           - Decrease Pantoprazole to once daily but continue  present medications                           - Await pathology results.                           - Perform an abdominal ultrasound at appointment to                            be scheduled. Procedure Code(s):        --- Professional ---                           [phone], Esophagogastroduodenoscopy, flexible,                            transoral; with biopsy, single or multiple                           99153, Moderate sedation; each additional 15                            minutes intraservice time                           G0500, Moderate sedation services provided by the                            same physician or other qualified health care                            professional performing a gastrointestinal                            endoscopic service that sedation supports,                            requiring the presence of an independent trained                            observer to assist in the monitoring of the                            patient's level of  consciousness and physiological                            status; initial 15 minutes of intra-service time;                            patient age 47 years or older (additional time may                            be reported with [phone], as appropriate) Diagnosis Code(s):        --- Professional ---  K22.8, Other specified diseases of esophagus                           K44.9, Diaphragmatic hernia without obstruction or                            gangrene                           K31.7, Polyp of stomach and duodenum                           K29.70, Gastritis, unspecified, without bleeding                           R10.13, Epigastric pain                           K92.0, Hematemesis                           R11.2, Nausea with vomiting, unspecified                           R63.4, Abnormal weight loss CPT copyright 2019 American [employer]. All rights reserved. The codes documented in this report are preliminary and upon coder review may  be revised to meet current compliance requirements. Hildred Laser, MD Hildred Laser, MD 05/31/2019 11:50:06 AM This report has been signed electronically. Number of Addenda: 0

## 2019-05-31 NOTE — H&P (Addendum)
Wesley Cordova is an 53 y.o. male.   Chief Complaint: Patient is here for esophagogastroduodenoscopy. HPI: Patient is 53 year old Caucasian male who was in usual state of health until May 12, 2019 when he suddenly got sick after returning from a chair and.  He felt extremely nauseated.  He had profuse sweating.  He began to throw up.  He vomited blood.  He was seen in emergency room in [place].  He was hospitalized over night and discharged.  He had another episode a week later.  He was seen in our office on 05/23/2019.  He was asked to go back on pantoprazole 40 mg twice daily.  He had CBC he has had chemistry panel and lipase and these were normal except lipase was mildly elevated at 65.Marland Kitchen  He has not had any more episodes of nausea vomiting hematemesis melena.  He does not have a good appetite.  He has lost 18 pounds in the last 2 months or so.  He did experience epigastric and lower chest pain during the first episode.  He has not taken Excedrin in a month.  He does not take that medication very often.  No prior history of peptic ulcer disease.  He does not smoke cigarettes and drinks alcohol occasionally.  Past Medical History:  Diagnosis Date  . Arthritis   . Asthma   . Diverticulosis   . GERD (gastroesophageal reflux disease)   . Gout   . Multiple allergies     Past Surgical History:  Procedure Laterality Date  . APPENDECTOMY    . COLONOSCOPY N/A 02/22/2019   Procedure: COLONOSCOPY;  Surgeon: Rogene Houston, MD;  Location: [facility] SUITE;  Service: Endoscopy;  Laterality: N/A;  200  . KNEE SURGERY    . POLYPECTOMY  02/22/2019   Procedure: POLYPECTOMY;  Surgeon: Rogene Houston, MD;  Location: [facility] END[facility] SUITE;  Service: Endoscopy;;  colon  . SHOULDER SURGERY      Family History  Problem Relation Age of Onset  . Hypertension Father   . Heart disease Father   . Diabetes Mellitus II Mother   . Colon polyps Mother   . Diabetes Mother   . Liver disease Mother   . Prostate  cancer Maternal Uncle    Social History:  reports that he has never smoked. He has never used smokeless tobacco. He reports that he does not drink alcohol or use drugs.  Allergies: No Known Allergies  Medications Prior to Admission  Medication Sig Dispense Refill  . ALPRAZolam (XANAX) 0.5 MG tablet Take 0.5 mg by mouth 3 (three) times daily as needed.    Marland Kitchen aspirin-acetaminophen-caffeine (EXCEDRIN MIGRAINE) 250-250-65 MG tablet Take 2 tablets by mouth every 6 (six) hours as needed for headache.    . pantoprazole (PROTONIX) 40 MG tablet Take 1 tablet (40 mg total) by mouth 2 (two) times daily. Take 30 min before meals (Patient taking differently: Take 40 mg by mouth daily. Take 30 min before meals) 60 tablet 3  . sertraline (ZOLOFT) 100 MG tablet Take 100 mg by mouth 2 (two) times daily.       No results found for this or any previous visit (from the past 48 hour(s)). No results found.  Review of Systems  Blood pressure (!) 141/87, pulse (!) 59, temperature 98.8 F (37.1 C), temperature source Oral, resp. rate 14, height 5\' 7"  (1.702 m), SpO2 100 %. Physical Exam  Constitutional: He appears well-developed and well-nourished.  HENT:  Mouth/Throat: Oropharynx is clear and  moist.  Eyes: Conjunctivae are normal. No scleral icterus.  Neck: No thyromegaly present.  Cardiovascular: Normal rate, regular rhythm and normal heart sounds.  No murmur heard. Respiratory: Effort normal and breath sounds normal.  GI: Soft. He exhibits no distension and no mass. There is no abdominal tenderness.  Musculoskeletal:        General: No edema.  Lymphadenopathy:    He has no cervical adenopathy.  Neurological: He is alert.  Skin: Skin is warm and dry.     Assessment/Plan Upper GI bleed, severe nausea vomiting and epigastric pain. Diagnostic esophagogastroduodenoscopy  Hildred Laser, MD 05/31/2019, 11:04 AM

## 2019-05-31 NOTE — Discharge Instructions (Signed)
No aspirin or NSAIDs for 24 hours. Increase pantoprazole to 40 mg by mouth 30 minutes before breakfast daily. Resume other medications as before.  Keep Excedrin use to minimum as discussed. Resume usual diet. No driving for 24 hours. Abdominal ultrasound to be scheduled.   Office will call with U/S appointment.  Patient prefers Thursday or Friday  Physician will call with biopsy results.         Upper Endoscopy, Adult, Care After This sheet gives you information about how to care for yourself after your procedure. Your health care provider may also give you more specific instructions. If you have problems or questions, contact your health care provider. What can I expect after the procedure? After the procedure, it is common to have:  A sore throat.  Mild stomach pain or discomfort.  Bloating.  Nausea. Follow these instructions at home:   Follow instructions from your health care provider about what to eat or drink after your procedure.  Return to your normal activities as told by your health care provider. Ask your health care provider what activities are safe for you.  Take over-the-counter and prescription medicines only as told by your health care provider.  Do not drive for 24 hours if you were given a sedative during your procedure.  Keep all follow-up visits as told by your health care provider. This is important. Contact a health care provider if you have:  A sore throat that lasts longer than one day.  Trouble swallowing. Get help right away if:  You vomit blood or your vomit looks like coffee grounds.  You have: ? A fever. ? Bloody, black, or tarry stools. ? A severe sore throat or you cannot swallow. ? Difficulty breathing. ? Severe pain in your chest or abdomen. Summary  After the procedure, it is common to have a sore throat, mild stomach discomfort, bloating, and nausea.  Do not drive for 24 hours if you were given a sedative during the  procedure.  Follow instructions from your health care provider about what to eat or drink after your procedure.  Return to your normal activities as told by your health care provider. This information is not intended to replace advice given to you by your health care provider. Make sure you discuss any questions you have with your health care provider. Document Revised: 11/08/2017 Document Reviewed: 10/17/2017 Elsevier Patient Education  [place].

## 2019-06-04 ENCOUNTER — Other Ambulatory Visit: Payer: Self-pay

## 2019-06-04 ENCOUNTER — Other Ambulatory Visit (INDEPENDENT_AMBULATORY_CARE_PROVIDER_SITE_OTHER): Payer: Self-pay | Admitting: *Deleted

## 2019-06-04 DIAGNOSIS — R112 Nausea with vomiting, unspecified: Secondary | ICD-10-CM

## 2019-06-04 DIAGNOSIS — R109 Unspecified abdominal pain: Secondary | ICD-10-CM

## 2019-06-04 DIAGNOSIS — Z1211 Encounter for screening for malignant neoplasm of colon: Secondary | ICD-10-CM

## 2019-06-04 LAB — SURGICAL PATHOLOGY

## 2019-06-06 ENCOUNTER — Encounter (INDEPENDENT_AMBULATORY_CARE_PROVIDER_SITE_OTHER): Payer: Self-pay | Admitting: *Deleted

## 2019-06-08 ENCOUNTER — Ambulatory Visit (HOSPITAL_COMMUNITY)
Admission: RE | Admit: 2019-06-08 | Discharge: 2019-06-08 | Disposition: A | Discharge status: Home / Self Care | Payer: BC Managed Care – PPO | Source: Ambulatory Visit | Attending: Internal Medicine | Admitting: Internal Medicine

## 2019-06-08 ENCOUNTER — Other Ambulatory Visit: Payer: Self-pay

## 2019-06-08 DIAGNOSIS — R109 Unspecified abdominal pain: Secondary | ICD-10-CM | POA: Diagnosis present

## 2019-06-08 DIAGNOSIS — R112 Nausea with vomiting, unspecified: Secondary | ICD-10-CM | POA: Insufficient documentation

## 2019-06-12 ENCOUNTER — Other Ambulatory Visit (INDEPENDENT_AMBULATORY_CARE_PROVIDER_SITE_OTHER): Payer: Self-pay | Admitting: *Deleted

## 2019-06-12 DIAGNOSIS — R112 Nausea with vomiting, unspecified: Secondary | ICD-10-CM

## 2019-06-21 ENCOUNTER — Ambulatory Visit (HOSPITAL_COMMUNITY)
Admission: RE | Admit: 2019-06-21 | Discharge: 2019-06-21 | Disposition: A | Discharge status: Home / Self Care | Payer: BC Managed Care – PPO | Source: Ambulatory Visit | Attending: Internal Medicine | Admitting: Internal Medicine

## 2019-06-21 ENCOUNTER — Other Ambulatory Visit: Payer: Self-pay

## 2019-06-21 DIAGNOSIS — R112 Nausea with vomiting, unspecified: Secondary | ICD-10-CM | POA: Insufficient documentation

## 2019-06-21 MED ORDER — TECHNETIUM TC 99M MEBROFENIN IV KIT
5.0000 | PACK | Freq: Once | INTRAVENOUS | Status: AC | PRN
  Administered 2019-06-21: 5.1 via INTRAVENOUS

## 2019-06-25 ENCOUNTER — Other Ambulatory Visit (INDEPENDENT_AMBULATORY_CARE_PROVIDER_SITE_OTHER): Payer: Self-pay | Admitting: *Deleted

## 2019-06-25 DIAGNOSIS — R1013 Epigastric pain: Secondary | ICD-10-CM

## 2019-07-04 ENCOUNTER — Other Ambulatory Visit (INDEPENDENT_AMBULATORY_CARE_PROVIDER_SITE_OTHER): Payer: Self-pay | Admitting: *Deleted

## 2019-07-05 ENCOUNTER — Ambulatory Visit (HOSPITAL_COMMUNITY): Admission: RE | Admit: 2019-07-05 | Payer: BC Managed Care – PPO | Source: Ambulatory Visit

## 2019-07-13 ENCOUNTER — Ambulatory Visit (HOSPITAL_COMMUNITY)
Admission: RE | Admit: 2019-07-13 | Discharge: 2019-07-13 | Disposition: A | Discharge status: Home / Self Care | Payer: BC Managed Care – PPO | Source: Ambulatory Visit | Attending: Internal Medicine | Admitting: Internal Medicine

## 2019-07-13 ENCOUNTER — Other Ambulatory Visit: Payer: Self-pay

## 2019-07-13 DIAGNOSIS — R1013 Epigastric pain: Secondary | ICD-10-CM | POA: Insufficient documentation

## 2019-07-13 MED ORDER — IOHEXOL 300 MG/ML  SOLN
100.0000 mL | Freq: Once | INTRAMUSCULAR | Status: AC | PRN
  Administered 2019-07-13: 15:00:00 100 mL via INTRAVENOUS

## 2019-07-23 ENCOUNTER — Other Ambulatory Visit (HOSPITAL_COMMUNITY): Payer: BC Managed Care – PPO

## 2019-10-15 ENCOUNTER — Telehealth (INDEPENDENT_AMBULATORY_CARE_PROVIDER_SITE_OTHER): Payer: Self-pay | Admitting: Internal Medicine

## 2019-10-15 NOTE — Telephone Encounter (Signed)
Patients wife called stated patient went to [facility] over the weekend - would like a call back to make sure they put him on the correct medication for diverticulitis - ph# [phone]

## 2019-10-17 NOTE — Telephone Encounter (Signed)
Talked with the patients wife.  She states that because I had not called her back and jer message was 2 days ago. She called Dr.Waldrops (PCP) office. They prescribed Cipro and Flagyl. As of today the patient is terribly sore in his abdomen. She sent him tohis PCP this morning to have lab work. They have not heard from it yet.  At the ED his WBC was 14 , they gave him Augmentin. She stopped this one and started the Cirpo and Flagyl. I told her that this is the medication of choice. That I would make Dr.Rehman aware.

## 2019-10-17 NOTE — Telephone Encounter (Signed)
Dr.Rehman made aware. 

## 2019-12-25 ENCOUNTER — Telehealth (INDEPENDENT_AMBULATORY_CARE_PROVIDER_SITE_OTHER): Payer: Self-pay | Admitting: *Deleted

## 2019-12-25 ENCOUNTER — Other Ambulatory Visit (INDEPENDENT_AMBULATORY_CARE_PROVIDER_SITE_OTHER): Payer: Self-pay | Admitting: Internal Medicine

## 2019-12-25 MED ORDER — METRONIDAZOLE 500 MG PO TABS: 500.0000 mg | ORAL_TABLET | Freq: Two times a day (BID) | ORAL | 0 refills | Status: DC

## 2019-12-25 MED ORDER — CIPROFLOXACIN HCL 250 MG PO TABS: 500.0000 mg | ORAL_TABLET | Freq: Two times a day (BID) | ORAL | 0 refills | Status: DC

## 2019-12-25 MED ORDER — ONDANSETRON HCL 4 MG PO TABS: 4.0000 mg | ORAL_TABLET | Freq: Three times a day (TID) | ORAL | 0 refills | Status: DC | PRN

## 2019-12-25 NOTE — Telephone Encounter (Signed)
Patient called twice but did not answer. Message left

## 2019-12-25 NOTE — Telephone Encounter (Signed)
Patient's wife called and said that he was having another Diverticulitis, and he just had one in May.  She is asking that if she shoulf bring him to the ED at [facility].  [phone]

## 2019-12-25 NOTE — Progress Notes (Signed)
Patient's wife calls stating that he is having another episode of diverticulitis chest likely has had before most recently 2 months ago.  Patient has already started taking Cipro and Flagyl which she had leftover from last prescription. Prescription for Cipro, metronidazole and ondansetron sent to patient's pharmacy. Patient advised to stay on clear liquids for 24 hours. If he is unable to keep medicines down he will have to come to emergency room or if he has fever greater than 101F Will arrange for office visit next week.

## 2019-12-25 NOTE — Telephone Encounter (Signed)
Patient needs office visit next week 

## 2019-12-28 ENCOUNTER — Encounter (HOSPITAL_COMMUNITY): Payer: Self-pay | Admitting: *Deleted

## 2019-12-28 ENCOUNTER — Emergency Department (HOSPITAL_COMMUNITY)
Admission: EM | Admit: 2019-12-28 | Discharge: 2019-12-28 | Disposition: A | Discharge status: Home / Self Care | Payer: BC Managed Care – PPO | Attending: Emergency Medicine | Admitting: Emergency Medicine

## 2019-12-28 ENCOUNTER — Emergency Department (HOSPITAL_COMMUNITY): Payer: BC Managed Care – PPO

## 2019-12-28 ENCOUNTER — Other Ambulatory Visit: Payer: Self-pay

## 2019-12-28 DIAGNOSIS — J45909 Unspecified asthma, uncomplicated: Secondary | ICD-10-CM | POA: Diagnosis not present

## 2019-12-28 DIAGNOSIS — K5732 Diverticulitis of large intestine without perforation or abscess without bleeding: Secondary | ICD-10-CM | POA: Insufficient documentation

## 2019-12-28 DIAGNOSIS — R1030 Lower abdominal pain, unspecified: Secondary | ICD-10-CM | POA: Diagnosis present

## 2019-12-28 DIAGNOSIS — Z7982 Long term (current) use of aspirin: Secondary | ICD-10-CM | POA: Diagnosis not present

## 2019-12-28 DIAGNOSIS — K5792 Diverticulitis of intestine, part unspecified, without perforation or abscess without bleeding: Secondary | ICD-10-CM

## 2019-12-28 LAB — URINALYSIS, ROUTINE W REFLEX MICROSCOPIC
Bacteria, UA: NONE SEEN
Bilirubin Urine: NEGATIVE
Glucose, UA: NEGATIVE mg/dL
Ketones, ur: NEGATIVE mg/dL
Nitrite: NEGATIVE
Protein, ur: NEGATIVE mg/dL
Specific Gravity, Urine: 1.032 — ABNORMAL HIGH (ref 1.005–1.030)
pH: 7 (ref 5.0–8.0)

## 2019-12-28 LAB — COMPREHENSIVE METABOLIC PANEL
ALT: 17 U/L (ref 0–44)
AST: 14 U/L — ABNORMAL LOW (ref 15–41)
Albumin: 4.2 g/dL (ref 3.5–5.0)
Alkaline Phosphatase: 67 U/L (ref 38–126)
Anion gap: 8 (ref 5–15)
BUN: 18 mg/dL (ref 6–20)
CO2: 26 mmol/L (ref 22–32)
Calcium: 8.8 mg/dL — ABNORMAL LOW (ref 8.9–10.3)
Chloride: 101 mmol/L (ref 98–111)
Creatinine, Ser: 1.3 mg/dL — ABNORMAL HIGH (ref 0.61–1.24)
GFR calc Af Amer: >60 mL/min (ref >60)
GFR calc non Af Amer: >60 mL/min (ref >60)
Glucose, Bld: 104 mg/dL — ABNORMAL HIGH (ref 70–99)
Potassium: 3.9 mmol/L (ref 3.5–5.1)
Sodium: 135 mmol/L (ref 135–145)
Total Bilirubin: 1 mg/dL (ref 0.3–1.2)
Total Protein: 6.7 g/dL (ref 6.5–8.1)

## 2019-12-28 LAB — CBC WITH DIFFERENTIAL/PLATELET
Abs Immature Granulocytes: 0.03 10*3/uL (ref 0.00–0.07)
Basophils Absolute: 0 10*3/uL (ref 0.0–0.1)
Basophils Relative: 1 %
Eosinophils Absolute: 0.2 10*3/uL (ref 0.0–0.5)
Eosinophils Relative: 2 %
HCT: 40.6 % (ref 39.0–52.0)
Hemoglobin: 14.3 g/dL (ref 13.0–17.0)
Immature Granulocytes: 0 %
Lymphocytes Relative: 18 %
Lymphs Abs: 1.5 10*3/uL (ref 0.7–4.0)
MCH: 32.5 pg (ref 26.0–34.0)
MCHC: 35.2 g/dL (ref 30.0–36.0)
MCV: 92.3 fL (ref 80.0–100.0)
Monocytes Absolute: 0.7 10*3/uL (ref 0.1–1.0)
Monocytes Relative: 9 %
Neutro Abs: 5.8 10*3/uL (ref 1.7–7.7)
Neutrophils Relative %: 70 %
Platelets: 271 10*3/uL (ref 150–400)
RBC: 4.4 MIL/uL (ref 4.22–5.81)
RDW: 12.1 % (ref 11.5–15.5)
WBC: 8.3 10*3/uL (ref 4.0–10.5)
nRBC: 0 % (ref 0.0–0.2)

## 2019-12-28 MED ORDER — MORPHINE SULFATE (PF) 4 MG/ML IV SOLN
4.0000 mg | Freq: Once | INTRAVENOUS | Status: AC
  Administered 2019-12-28: 4 mg via INTRAVENOUS
  Filled 2019-12-28: qty 1

## 2019-12-28 MED ORDER — IOHEXOL 300 MG/ML  SOLN
100.0000 mL | Freq: Once | INTRAMUSCULAR | Status: AC | PRN
  Administered 2019-12-28: 100 mL via INTRAVENOUS

## 2019-12-28 NOTE — ED Provider Notes (Signed)
[facility] EMERGENCY DEPARTMENT Provider Note   CSN: [id number] Arrival date & time: 12/28/19  1023     History Chief Complaint  Patient presents with  . Abdominal Pain    Wesley Cordova is a 54 y.o. male with a past medical history of diverticulosis, GERD presenting to the ED with a chief complaint of left lower quadrant and suprapubic abdominal pain.  Symptoms began 5 days ago with associated diarrhea, nausea and nonbloody, nonbilious emesis.  He was concerned that he had a flareup of his diverticulitis so his wife gave him some leftover ciprofloxacin and Flagyl.  He began taking this and called his GI doctor who called in a complete prescription for the antibiotics as well as Zofran.  He had some improvement in his symptoms up until today while he was at work.  Last night he felt like he had a subjective fever.  Pain in the left lower quadrant has now shifted to the suprapubic area and is worse than when symptoms began 5 days ago.  Continues to have diarrhea and nausea as well.  Reports flareup of diverticulitis since 2018.  He has required hospitalization once but as far as he knows, no complicated features of diverticulitis in the past that required IR or surgical intervention.  He took Tylenol earlier today.  Denies any urinary symptoms, bloody stools, chest pain, shortness of breath, cough, sick contacts with similar symptoms, suspicious food intake.  HPI     Past Medical History:  Diagnosis Date  . Arthritis   . Asthma   . Diverticulosis   . GERD (gastroesophageal reflux disease)   . Gout   . Multiple allergies     Patient Active Problem List   Diagnosis Date Noted  . Abdominal pain, epigastric 05/23/2019  . Hematemesis with nausea 05/23/2019  . Loss of weight 05/23/2019  . Diverticulitis of colon 12/21/2018  . Colon cancer screening 12/21/2018  . History of esophageal stricture 07/26/2014    Past Surgical History:  Procedure Laterality Date  . APPENDECTOMY    .  BIOPSY  05/31/2019   Procedure: BIOPSY;  Surgeon: Rogene Houston, MD;  Location: [facility];  Service: Endoscopy;;  gastric  . COLONOSCOPY N/A 02/22/2019   Procedure: COLONOSCOPY;  Surgeon: Rogene Houston, MD;  Location: [facility];  Service: Endoscopy;  Laterality: N/A;  200  . ESOPHAGOGASTRODUODENOSCOPY N/A 05/31/2019   Procedure: ESOPHAGOGASTRODUODENOSCOPY (EGD);  Surgeon: Rogene Houston, MD;  Location: [facility];  Service: Endoscopy;  Laterality: N/A;  2:25  . KNEE SURGERY    . POLYPECTOMY  02/22/2019   Procedure: POLYPECTOMY;  Surgeon: Rogene Houston, MD;  Location: [facility];  Service: Endoscopy;;  colon  . SHOULDER SURGERY         Family History  Problem Relation Age of Onset  . Hypertension Father   . Heart disease Father   . Diabetes Mellitus II Mother   . Colon polyps Mother   . Diabetes Mother   . Liver disease Mother   . Prostate cancer Maternal Uncle     Social History   Tobacco Use  . Smoking status: Never Smoker  . Smokeless tobacco: Never Used  Vaping Use  . Vaping Use: Never used  Substance Use Topics  . Alcohol use: No    Alcohol/week: 0.0 standard drinks    Comment: Social bases only  . Drug use: No    Home Medications Prior to Admission medications   Medication Sig Start Date End Date  Taking? Authorizing Provider  acetaminophen (TYLENOL) 500 MG tablet Take 1,000 mg by mouth every 6 (six) hours as needed.   Yes [provider]  ciprofloxacin (CIPRO) 250 MG tablet Take 2 tablets (500 mg total) by mouth 2 (two) times daily. 12/25/19  Yes Rehman, Mechele Dawley, MD  metroNIDAZOLE (FLAGYL) 500 MG tablet Take 1 tablet (500 mg total) by mouth 2 (two) times daily. 12/25/19  Yes Rehman, Mechele Dawley, MD  pantoprazole (PROTONIX) 40 MG tablet Take 1 tablet (40 mg total) by mouth daily before breakfast. Take 30 min before meals 05/31/19  Yes Rehman, Najeeb U, MD  sertraline (ZOLOFT) 100 MG tablet Take 100 mg by mouth 2 (two) times daily.    Yes  [provider]  ALPRAZolam Duanne Moron) 0.5 MG tablet Take 0.5 mg by mouth 3 (three) times daily as needed. Patient not taking: Reported on 12/28/2019 05/22/19   [provider]  aspirin-acetaminophen-caffeine (EXCEDRIN MIGRAINE) [id number] MG tablet Take 2 tablets by mouth every 6 (six) hours as needed for headache. Patient not taking: Reported on 12/28/2019    [provider]  ondansetron (ZOFRAN) 4 MG tablet Take 1 tablet (4 mg total) by mouth every 8 (eight) hours as needed for nausea or vomiting. Patient not taking: Reported on 12/28/2019 12/25/19   Rogene Houston, MD    Allergies    Patient has no known allergies.  Review of Systems   Review of Systems  Constitutional: Positive for chills. Negative for appetite change and fever.  HENT: Negative for ear pain, rhinorrhea, sneezing and sore throat.   Eyes: Negative for photophobia and visual disturbance.  Respiratory: Negative for cough, chest tightness, shortness of breath and wheezing.   Cardiovascular: Negative for chest pain and palpitations.  Gastrointestinal: Positive for abdominal pain, diarrhea, nausea and vomiting. Negative for blood in stool and constipation.  Genitourinary: Negative for dysuria, hematuria and urgency.  Musculoskeletal: Negative for myalgias.  Skin: Negative for rash.  Neurological: Negative for dizziness, weakness and light-headedness.    Physical Exam Updated Vital Signs BP 122/79   Pulse 58   Temp 98.8 F (37.1 C) (Oral)   Resp 18   Ht 5\' 8"  (1.727 m)   Wt 83.9 kg   SpO2 100%   BMI 28.13 kg/m   Physical Exam Vitals and nursing note reviewed.  Constitutional:      General: He is not in acute distress.    Appearance: He is well-developed.  HENT:     Head: Normocephalic and atraumatic.     Nose: Nose normal.  Eyes:     General: No scleral icterus.       Left eye: No discharge.     Conjunctiva/sclera: Conjunctivae normal.  Cardiovascular:     Rate and Rhythm: Normal  rate and regular rhythm.     Heart sounds: Normal heart sounds. No murmur heard.  No friction rub. No gallop.   Pulmonary:     Effort: Pulmonary effort is normal. No respiratory distress.     Breath sounds: Normal breath sounds.  Abdominal:     General: Bowel sounds are normal. There is no distension.     Palpations: Abdomen is soft.     Tenderness: There is abdominal tenderness in the suprapubic area and left lower quadrant. There is no guarding.  Musculoskeletal:        General: Normal range of motion.     Cervical back: Normal range of motion and neck supple.  Skin:    General: Skin is warm  and dry.     Findings: No rash.  Neurological:     Mental Status: He is alert.     Motor: No abnormal muscle tone.     Coordination: Coordination normal.     ED Results / Procedures / Treatments   Labs (all labs ordered are listed, but only abnormal results are displayed) Labs Reviewed  COMPREHENSIVE METABOLIC PANEL - Abnormal; Notable for the following components:      Result Value   Glucose, Bld 104 (*)    Creatinine, Ser 1.30 (*)    Calcium 8.8 (*)    AST 14 (*)    All other components within normal limits  URINALYSIS, ROUTINE W REFLEX MICROSCOPIC - Abnormal; Notable for the following components:   Specific Gravity, Urine 1.032 (*)    Hgb urine dipstick SMALL (*)    Leukocytes,Ua TRACE (*)    All other components within normal limits  CBC WITH DIFFERENTIAL/PLATELET    EKG None  Radiology CT ABDOMEN PELVIS W CONTRAST  Result Date: 12/28/2019 CLINICAL DATA:  Diverticulitis of the low abdomen. Pain started on 12/24/2019. Post antibiotic therapy for 5 days. EXAM: CT ABDOMEN AND PELVIS WITH CONTRAST TECHNIQUE: Multidetector CT imaging of the abdomen and pelvis was performed using the standard protocol following bolus administration of intravenous contrast. CONTRAST:  146mL OMNIPAQUE IOHEXOL 300 MG/ML  SOLN COMPARISON:  07/13/2019 FINDINGS: Lower chest: Basilar atelectasis. No  consolidation. No pleural effusion. Hepatobiliary: Normal liver. No pericholecystic stranding. No biliary duct dilation. Pancreas: Pancreas is normal without ductal dilation, inflammation or focal lesion. Spleen: Spleen normal in size and contour. Adrenals/Urinary Tract: Adrenal glands are normal. Symmetric renal enhancement.  No focal, suspicious renal lesion. Stomach/Bowel: CT appearance of stomach and small bowel is unremarkable. No perienteric stranding. No signs of small bowel obstruction. Signs of appendectomy. Colonic diverticulosis with 2 foci of diverticulitis 1 involving the mid descending colon in the other at the descending/sigmoid junction. No free air. No abscess. No omental stranding. No pelvic fluid. Vascular/Lymphatic: No aneurysmal dilation of the abdominal aorta. No adenopathy in the retroperitoneum or upper abdomen. No pelvic lymphadenopathy. Reproductive: Prostate with mild heterogeneity. Nonspecific finding on CT. Other: Fat containing umbilical hernia. Musculoskeletal: No acute musculoskeletal process. No destructive bone finding. 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 IMPRESSION: 1. Multifocal diverticulitis involving descending and proximal sigmoid colon as described. No abscess. No free air. 2. Signs of appendectomy. 3. Fat containing umbilical hernia. Electronically Signed   By: Zetta Bills M.D.   On: 12/28/2019 13:23    Procedures Procedures (including critical care time)  Medications Ordered in ED Medications  morphine 4 MG/ML injection 4 mg (4 mg Intravenous Given 12/28/19 1138)  iohexol (OMNIPAQUE) 300 MG/ML solution 100 mL (100 mLs Intravenous Contrast Given 12/28/19 1254)    ED Course  I have reviewed the triage vital signs and the nursing notes.  Pertinent labs & imaging results that were available during my care of the patient were reviewed by me and considered in my medical decision making (see chart for details).    MDM  Rules/Calculators/A&P                          54 year old male with past medical history of diverticulosis, GERD presenting to the ED with a chief complaint of left lower quadrant and suprapubic abdominal pain. 5 days ago started having the symptoms with associated diarrhea, emesis. Concerned that he had a flareup of  diverticulitis so he began taking leftover Cipro and Flagyl. Symptoms improve up until this morning when he had worsening pain. His GI doctor was contacted earlier in the week and sent him complete prescription for antibiotics as well as Zofran. Reports subjective fever last night. On exam patient has tenderness palpation of the left lower quadrant and suprapubic area without rebound or guarding. He is afebrile without recent use of antipyretics. CBC, CMP unremarkable. Urinalysis with trace leukocytes but otherwise unremarkable. CT of the abdomen pelvis shows diverticulitis without abscess, free air or other complicating features. Stressed importance of completing antibiotic therapy for future infections including this episode of diverticulitis. We'll have him continue Cipro, Flagyl and Zofran as needed. Patient advised to follow-up with his GI doctor and return for worsening symptoms.   Patient is hemodynamically stable, in NAD, and able to ambulate in the ED. Evaluation does not show pathology that would require ongoing emergent intervention or inpatient treatment. I explained the diagnosis to the patient. Pain has been managed and has no complaints prior to discharge. Patient is comfortable with above plan and is stable for discharge at this time. All questions were answered prior to disposition. Strict return precautions for returning to the ED were discussed. Encouraged follow up with PCP.   An After Visit Summary was printed and given to the patient.   Portions of this note were generated with Lobbyist. Dictation errors may occur despite best attempts at  proofreading.  Final Clinical Impression(s) / ED Diagnoses Final diagnoses:  Diverticulitis    Rx / DC Orders ED Discharge Orders    None       Delia Heady, PA-C 12/28/19 1357    Long, Wonda Olds, MD 01/03/20 [phone]

## 2019-12-28 NOTE — Discharge Instructions (Addendum)
Continue taking Cipro and Flagyl. You can continue Zofran as needed as well. Take Tylenol as needed for pain. Follow-up with your GI doctor. Return to the ER if you develop a fever, chest pain, shortness of breath, bloody stools.

## 2019-12-28 NOTE — ED Triage Notes (Signed)
Low abdominal pain onset 5 days ago , got better but has increased in pain today, history of diverticulitis

## 2019-12-28 NOTE — ED Notes (Signed)
To CT

## 2019-12-31 ENCOUNTER — Other Ambulatory Visit: Payer: Self-pay

## 2019-12-31 ENCOUNTER — Ambulatory Visit (INDEPENDENT_AMBULATORY_CARE_PROVIDER_SITE_OTHER): Payer: BC Managed Care – PPO | Admitting: Internal Medicine

## 2019-12-31 ENCOUNTER — Encounter (INDEPENDENT_AMBULATORY_CARE_PROVIDER_SITE_OTHER): Payer: Self-pay | Admitting: Internal Medicine

## 2019-12-31 DIAGNOSIS — K5732 Diverticulitis of large intestine without perforation or abscess without bleeding: Secondary | ICD-10-CM | POA: Diagnosis not present

## 2019-12-31 NOTE — Patient Instructions (Signed)
Surgical consultation with Dr. Audrie Lia be arranged later this month. Progress report this weekend.  Please call the hospital operator and they will page Dr. Roderic Palau.

## 2019-12-31 NOTE — Progress Notes (Signed)
Presenting complaint;  Follow-up for diverticulitis.  Database and subjective:  Patient is a 54 year old Caucasian male who is here for scheduled visit accompanied by his wife [name].  He has history of diverticulitis dating back to May 2017. He underwent screening colonoscopy in September 2020 revealing scattered diverticuli at sigmoid colon.  4 small polyps removed and these are tubular adenomas. Patient's wife called our office on Oct 15, 2019 to let us know that he was seen in emergency room and diagnosis of sigmoid diverticulitis was made.  His WBC was 14,000.  He was discharged on Augmentin.  He was switched to Cipro and metronidazole by his primary care physician Dr. Nanda Quinton.  His symptoms resolved. He was in usual state of health until 15 days ago when he was at work when he developed nausea and vomiting followed by abdominal pain.  He also had low-grade fever.  He had leftover prescription of Cipro and metronidazole and he started to take it.  He called our office on 12/25/2019 or 2 weeks ago and he was keeping liquids down.  Prescription for Cipro and metronidazole was reviewed. He thought he was feeling better.  Worsened and he was seen in emergency room on 12/28/2019. WBC was normal at 8.3.  Rest of the CBC was normal.  Abdominal pelvic CT revealed 2 foci of diverticulitis involving mid descending colon the junction of descending and sigmoid colon.  There was no abscess or pneumoperitoneum.  Patient advised to continue antibiotic and was discharged to be followed in our office.  He states he is feeling better.  He is not complaining of pain in hypogastric region which she describes as a brick sitting in this area.  He had fever for 3 days with the onset of the symptoms but not thereafter.  He denies melena or rectal bleeding.  He is having 2 stools per day.  His wife says he does not eat greens or salads.  He denies dysuria or hematuria He feels he has lost 8 pounds since his symptoms  began. He was hospitalized once in 2018 he has been seen in emergency room on at least 4 different occasions. Patient does not take OTC NSAIDs.  He he denies history of constipation or diarrhea.     Current Medications: Outpatient Encounter Medications as of 12/31/2019  Medication Sig  . acetaminophen (TYLENOL) 500 MG tablet Take 1,000 mg by mouth every 6 (six) hours as needed.  . ALPRAZolam (XANAX) 0.5 MG tablet Take 0.5 mg by mouth 3 (three) times daily as needed.   Marland Kitchen aspirin-acetaminophen-caffeine (EXCEDRIN MIGRAINE) 250-250-65 MG tablet Take 2 tablets by mouth every 6 (six) hours as needed for headache.   . ciprofloxacin (CIPRO) 250 MG tablet Take 2 tablets (500 mg total) by mouth 2 (two) times daily.  . metroNIDAZOLE (FLAGYL) 500 MG tablet Take 1 tablet (500 mg total) by mouth 2 (two) times daily.  . ondansetron (ZOFRAN) 4 MG tablet Take 1 tablet (4 mg total) by mouth every 8 (eight) hours as needed for nausea or vomiting.  . pantoprazole (PROTONIX) 40 MG tablet Take 1 tablet (40 mg total) by mouth daily before breakfast. Take 30 min before meals  . sertraline (ZOLOFT) 100 MG tablet Take 100 mg by mouth 2 (two) times daily.    No facility-administered encounter medications on file as of 12/31/2019.     Objective:  BP 1130/64    Pulse 58   Temp 98.7 F (36.5 C) (Oral)   Ht '5\' 8"'  (1.727 m)  Wt 187 lbs. Conjunctiva is pink. Sclera is nonicteric Oropharyngeal mucosa is normal. No neck masses or thyromegaly noted. Cardiac exam with regular rhythm normal S1 and S2. No murmur or gallop noted. Lungs are clear to auscultation. Abdomen is symmetrical.  Bowel sounds are normal.  On palpation abdomen is soft.  He has mild tenderness in left lower quadrant and more pronounced tenderness in hypogastric region without any guarding.  No organomegaly or masses No LE edema or clubbing noted.  Labs/studies Results:   CBC Latest Ref Rng & Units 12/28/2019 05/23/2019  WBC 4.0 - 10.5 K/uL 8.3  7.6  Hemoglobin 13.0 - 17.0 g/dL 14.3 14.5  Hematocrit 39 - 52 % 40.6 41.3  Platelets 150 - 400 K/uL 271 278    CMP Latest Ref Rng & Units 12/28/2019 05/23/2019  Glucose 70 - 99 mg/dL 104(H) 100(H)  BUN 6 - 20 mg/dL 18 10  Creatinine 0.61 - 1.24 mg/dL 1.30(H) 1.24  Sodium 135 - 145 mmol/L 135 139  Potassium 3.5 - 5.1 mmol/L 3.9 4.8  Chloride 98 - 111 mmol/L 101 104  CO2 22 - 32 mmol/L 26 28  Calcium 8.9 - 10.3 mg/dL 8.8(L) 9.4  Total Protein 6.5 - 8.1 g/dL 6.7 6.4  Total Bilirubin 0.3 - 1.2 mg/dL 1.0 0.5  Alkaline Phos 38 - 126 U/L 67 -  AST 15 - 41 U/L 14(L) 18  ALT 0 - 44 U/L 17 17    Hepatic Function Latest Ref Rng & Units 12/28/2019 05/23/2019  Total Protein 6.5 - 8.1 g/dL 6.7 6.4  Albumin 3.5 - 5.0 g/dL 4.2 -  AST 15 - 41 U/L 14(L) 18  ALT 0 - 44 U/L 17 17  Alk Phosphatase 38 - 126 U/L 67 -  Total Bilirubin 0.3 - 1.2 mg/dL 1.0 0.5    Abdominal pelvic CT from 12/28/2019 reviewed. Findings as above; pericolonic stranding more pronounced in proximal sigmoid colon   Assessment:  #1.  Left-sided diverticulitis.  This is patient's third episode since December 2020.  He has been seen in emergency room on multiple occasions and hospitalized once about 3 years ago.  He has responded to antibiotic each time.  Family history significant for diverticulitis in mother and younger brother without any complications.  Patient's history as well as sonogram concerned that he may end up with complicated diverticulitis and should consider elective surgery. Would like to see Dr. Raymond Gurney of [facility] in [place].   Plan:  Patient will continue low residue diet for week. Starting next week he can go back to heart healthy diet and consider increasing intake of fiber rich foods as tolerated. Patient will continue Cipro and metronidazole at current dose.   Will treat him for total of 2 weeks. Patient advised to call over the weekend with a progress report. Sedation  with Dr. Herschell Dimes of [facility]. Office visit in 6 months.

## 2020-03-03 ENCOUNTER — Other Ambulatory Visit (INDEPENDENT_AMBULATORY_CARE_PROVIDER_SITE_OTHER): Payer: Self-pay | Admitting: *Deleted

## 2020-03-03 MED ORDER — PANTOPRAZOLE SODIUM 40 MG PO TBEC: 40.0000 mg | DELAYED_RELEASE_TABLET | Freq: Every day | ORAL | 5 refills | Status: AC

## 2020-03-03 NOTE — Telephone Encounter (Signed)
Yes this will be fine - thanks!

## 2020-03-03 NOTE — Telephone Encounter (Signed)
[facility]  may I call in refills for this patient? This is Pantoprazole. I do not recall seeing a refill request.

## 2020-03-03 NOTE — Telephone Encounter (Signed)
Patient's wife called and Wesley Cordova is out of refills for Pantoprazole - she states pharmacy has tried contacting us for refills

## 2020-03-03 NOTE — Telephone Encounter (Signed)
The refill for the Pantoprazole 40 mg has been faxed to the patient's pharmacy per Sheral Flow PA-C permission. Patient wife made aware.

## 2020-05-13 ENCOUNTER — Telehealth (INDEPENDENT_AMBULATORY_CARE_PROVIDER_SITE_OTHER): Payer: Self-pay | Admitting: Internal Medicine

## 2020-05-13 NOTE — Telephone Encounter (Signed)
Patients wife called stated she would like to talk to Dr Laural Golden or his nurse regarding the procedure patient had on 04/15/20 - stated NUR is aware the the procedure - ph# [phone]

## 2020-05-15 NOTE — Telephone Encounter (Signed)
Talked with Wesley Cordova ,patients wife. She shares that the patient did have a partial Colectomy done 04/15/2020 by Dr. Audrie Lia in [place] , [place]. This was done at [facility].  She shares that [name] has been in and out of the hospital since this time. One of the times the patient had inflammation at the mesentery. Patient now has Pneumonia but he is at home.  She is requesting that we get the patient's records from [facility] so that Dr.Rehman can be updated on all that has gone on. I called Dr.Honea's office  [phone] opt #[phone], and spoke with his nurse , Tharon Aquas. She will be faxing [place] all the information but it will be Monday December 20.  Wesley Cordova was called and made aware.

## 2020-05-20 ENCOUNTER — Telehealth (INDEPENDENT_AMBULATORY_CARE_PROVIDER_SITE_OTHER): Payer: Self-pay | Admitting: *Deleted

## 2020-05-20 NOTE — Telephone Encounter (Signed)
[name] left a voicemail today. She wanted to know if we had received records from Dr.Honea's office. She states that he was seen yesterday for a bellybutton. She states that it was red and protruding. Itwas lanced and packed with gauze and will be changed today. They were told it was a abscess.  The doctor put him on Clindamycin 300 mg and told him to take it 3 times a day. Patient was already taking this medication 150 mg three times daily since 05/14/2020, and this was given for the treatment of Pneumonia.  [name] then called the patient's PCP and made them aware. She was advised not to give additional dosing. To just continue the 300 mg three times a day for 7 days.  He is now 5 weeks post surgery and is not getting any better. She shares that he has lost 25 lbs. Patient had the Colectomy on 04/15/2020. Then infection in the incision, later told it was in the mesentery. Then this was followed by patient having Pneumonia. Now the abscess with the belly button.  She questions if the antibiotic is even working? Dr.Honea did ask that they call him if patient starts having diarrhea as he is concerned for possible C-Diff.  Michelle's number is [phone].

## 2020-05-21 NOTE — Telephone Encounter (Signed)
Talk with patient and his wife. He is at risk for C. Difficile. He is on probiotic and eating yogurt every day when he should continue [name] will call office if he gets diarrhea in which case will proceed with stool testing.

## 2020-05-22 NOTE — Telephone Encounter (Signed)
Note:

## 2020-05-27 ENCOUNTER — Other Ambulatory Visit (INDEPENDENT_AMBULATORY_CARE_PROVIDER_SITE_OTHER): Payer: Self-pay | Admitting: Internal Medicine

## 2020-05-27 ENCOUNTER — Telehealth (INDEPENDENT_AMBULATORY_CARE_PROVIDER_SITE_OTHER): Payer: Self-pay | Admitting: Internal Medicine

## 2020-05-27 MED ORDER — ONDANSETRON HCL 4 MG PO TABS: 4.0000 mg | ORAL_TABLET | Freq: Three times a day (TID) | ORAL | 0 refills | Status: AC | PRN

## 2020-05-27 NOTE — Telephone Encounter (Signed)
Patient aware, but states she had already given the patient ondansetron three times today. Made Dr. Karilyn Cota aware of this and states the patient can increase the ondansetron to 8 mg and he can not see the patient tomorrow in the office as he is in the hospital working tomorrow. He suggests that the patient please contact the surgeon to see if he can work patient in and if not go to local ED. Patient's wife aware and states understanding.

## 2020-05-27 NOTE — Telephone Encounter (Signed)
Patient wife Wesley Cordova called today stating the patient has been vomiting this am. He has vomited five times since 10:30 am this am. He recently had surgery per wife on 04/15/2020. Per wife the patient has been on clindamycin for the last five days and had no problem with it in the past, but he did start vomiting after taking it this am on an empty stomach. She states he has an abscess at his incision sight. Patient is not running a temp but is having chills and sweating, she states the chills and sweating is normal for when he vomits, she says he is weak and really does not want the patient to have to sit and wait in an ER. Please advise.

## 2020-05-27 NOTE — Telephone Encounter (Signed)
Prescription for ondansetron sent to patient's pharmacy If ondansetron does not work we will consider promethazine in office visit. Please call patient

## 2020-05-27 NOTE — Telephone Encounter (Signed)
Spouse called the office stated Dr Karilyn Cota knows patient had surgery in [place] - states now he has started throwing up - she states he has been on an antibiotic because his incision got infected - would like to let Dr Karilyn Cota know so they will know what to do - please advise - ph#  [phone]

## 2020-05-27 NOTE — Telephone Encounter (Signed)
I called and left a detailed message regarding medication sent to local pharmacy let us know if it does not work as we will send in a different medication and a possible office visit if needed.  I asked that the patient wife call me back to confirm message.

## 2020-07-01 ENCOUNTER — Encounter (INDEPENDENT_AMBULATORY_CARE_PROVIDER_SITE_OTHER): Payer: Self-pay

## 2020-07-01 ENCOUNTER — Encounter (INDEPENDENT_AMBULATORY_CARE_PROVIDER_SITE_OTHER): Payer: Self-pay | Admitting: *Deleted

## 2020-07-08 ENCOUNTER — Ambulatory Visit (INDEPENDENT_AMBULATORY_CARE_PROVIDER_SITE_OTHER): Payer: BC Managed Care – PPO | Admitting: Internal Medicine

## 2020-07-08 ENCOUNTER — Other Ambulatory Visit: Payer: Self-pay

## 2020-07-08 ENCOUNTER — Encounter (INDEPENDENT_AMBULATORY_CARE_PROVIDER_SITE_OTHER): Payer: Self-pay | Admitting: Internal Medicine

## 2020-07-08 DIAGNOSIS — K219 Gastro-esophageal reflux disease without esophagitis: Secondary | ICD-10-CM | POA: Insufficient documentation

## 2020-07-08 DIAGNOSIS — Z8719 Personal history of other diseases of the digestive system: Secondary | ICD-10-CM

## 2020-07-08 NOTE — Patient Instructions (Signed)
Notify if you experience diarrhea and fever. Can try pantoprazole every other day after 4 to 6 months.

## 2020-07-08 NOTE — Progress Notes (Signed)
Presenting complaint;  History of diverticulitis and GERD.  Database and subjective:  Patient is 55 year old Caucasian male who is here for scheduled visit.  He was last seen in the office on 12/31/2019 for recurrent sigmoid diverticulitis.  He was referred to Dr. Audrie Lia of [facility] in [place].  He underwent left hemicolectomy and repair of umbilical hernia on 81/85/6314.  He was discharged a week later to be readmitted in first week of December with left-sided pneumonia and discharged in week later and readmitted on 05/27/2020 with nausea vomiting and fever and found to have abdominal wall abscess for which she required surgery.  He was also on TPN for about a week.  He had PICC line in left upper extremity which resulted in pulmonary embolism.  He has been on anticoagulant.  Dr. Audrie Lia wants him to continue anticoagulant for about 6 months. He states he is finally starting to feel better.  He feels he has recovered 75%.  He has returned to work as of last week and he is on light duty and he is working 5 to 6 hours a day.  He remains on clindamycin twice daily.  He states his wound is slowly healing.  He is applying topical antibiotic.  He has some drainage.  He has some soreness in mid abdomen.  He has not had any fever or chills.  He is having 2-3 formed stools daily.  He denies melena or rectal bleeding.  He has been gradually advancing his diet.  He is taking Tylenol but not every day.  He states he lost 30 pounds during this ordeal but he has gained 5 pounds back. He states heartburn is well controlled with PPI.  Current Medications: Outpatient Encounter Medications as of 07/08/2020  Medication Sig  . acetaminophen (TYLENOL) 500 MG tablet Take 1,000 mg by mouth every 6 (six) hours as needed.  . ALPRAZolam (XANAX) 0.5 MG tablet Take 0.5 mg by mouth 3 (three) times daily as needed.   Marland Kitchen apixaban (ELIQUIS) 5 MG TABS tablet Take 5 mg by mouth 2 (two) times daily.  Marland Kitchen  aspirin-acetaminophen-caffeine (EXCEDRIN MIGRAINE) 250-250-65 MG tablet Take 2 tablets by mouth every 6 (six) hours as needed for headache.   . clindamycin (CLEOCIN) 300 MG capsule Take 300 mg by mouth 2 (two) times daily.  . ondansetron (ZOFRAN) 4 MG tablet Take 1 tablet (4 mg total) by mouth every 8 (eight) hours as needed for nausea or vomiting.  . pantoprazole (PROTONIX) 40 MG tablet Take 1 tablet (40 mg total) by mouth daily before breakfast. Take 30 min before meals  . sertraline (ZOLOFT) 100 MG tablet Take 100 mg by mouth 2 (two) times daily.   . [DISCONTINUED] ciprofloxacin (CIPRO) 250 MG tablet Take 2 tablets (500 mg total) by mouth 2 (two) times daily.  . [DISCONTINUED] metroNIDAZOLE (FLAGYL) 500 MG tablet Take 1 tablet (500 mg total) by mouth 2 (two) times daily.   No facility-administered encounter medications on file as of 07/08/2020.     Objective: Blood pressure 133/84, pulse (!) 56, temperature 98.5 F (36.9 C), temperature source Oral, height 5\' 8"  (1.727 m), weight 170 lb (77.1 kg). Patient is alert and in no acute distress. He is wearing a mask. Conjunctiva is pink. Sclera is nonicteric Oropharyngeal mucosa is normal. No neck masses or thyromegaly noted. Cardiac exam with regular rhythm normal S1 and S2. No murmur or gallop noted. Lungs are clear to auscultation. Abdomen is symmetrical.  He has dressing covering the mid  abdomen.  Dressing was removed to examine wound.  He has about 1.5 cm wound with scant drainage which is not foul-smelling.  On palpation abdomen is soft and with mild vague tenderness in periumbilical region.  No organomegaly masses or guarding noted. No LE edema or clubbing noted.   Assessment:  #1.  History of recurrent left-sided diverticulitis status post left hemicolectomy about 12 weeks ago complicated by pneumonia in abdominal wall abscess requiring open drainage.  He also developed PE related to PICC line.  It appears finally he is on the road to  recovery. Hopefully he can come off antibiotic near future. He has office visit with Dr. Audrie Lia on 07/17/2020.  #2.  GERD.  Symptoms well controlled with PPI.  Plan:  Patient advised to continue probiotic and yogurt daily. Patient advised to call office if he develops fever and diarrhea. He can drop pantoprazole dose to every other day after 4 to 6 months. Office visit in 1 year.

## 2020-09-26 IMAGING — US US ABDOMEN COMPLETE
1 series · 14 of 25 positions shown · non-contrast
Comparison: None.

CLINICAL DATA: Abdominal pain, nausea, and vomiting for 5 weeks.

EXAM:
ABDOMEN ULTRASOUND COMPLETE

[Series 1: us abdomen complete · 0.22mm/px · 14 of 173 slices shown]
[im 1/173]
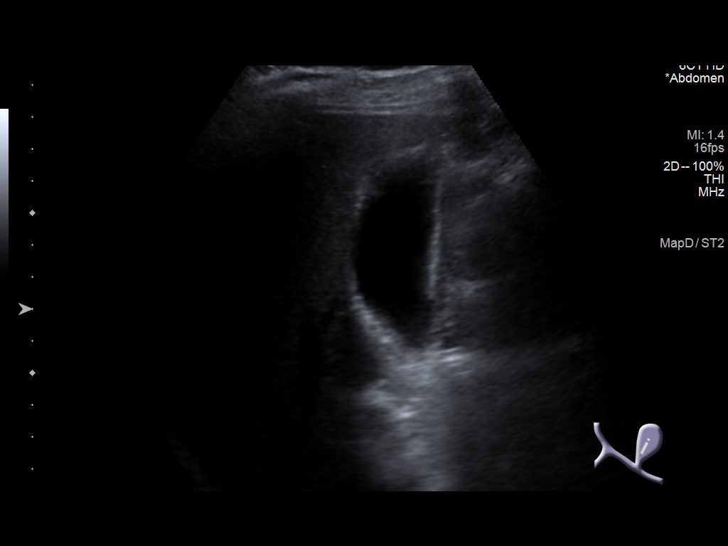
[im 15/173]
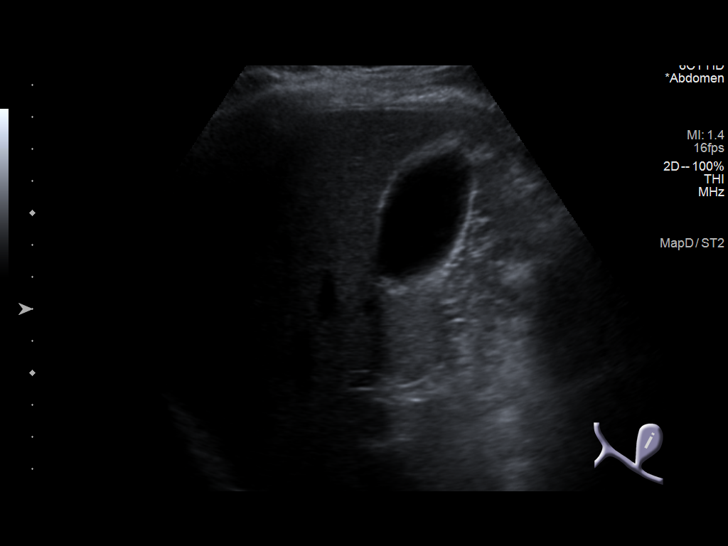
[im 29/173]
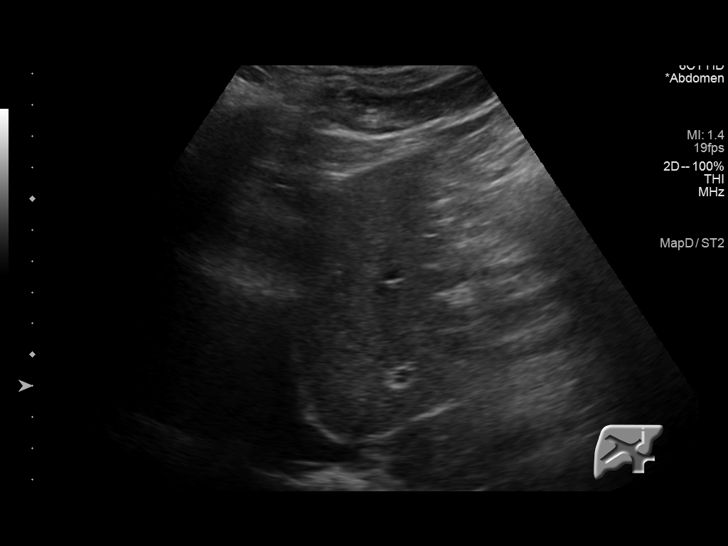
[im 44/173]
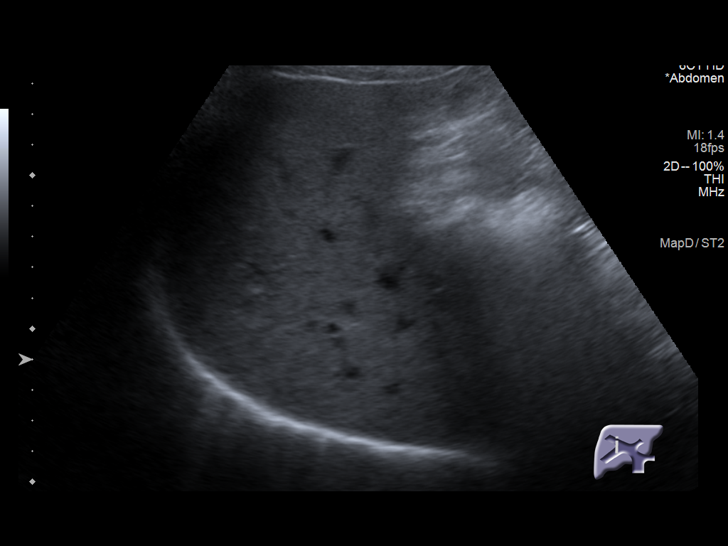
[im 58/173]
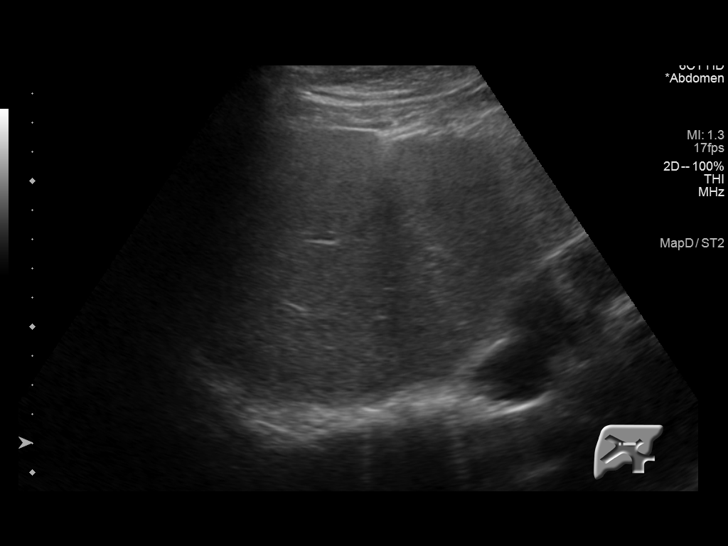
[im 65/173]
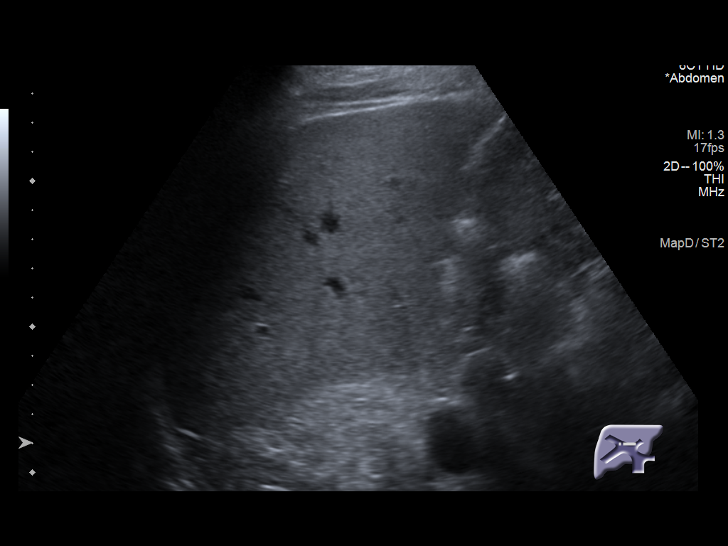
[im 79/173]
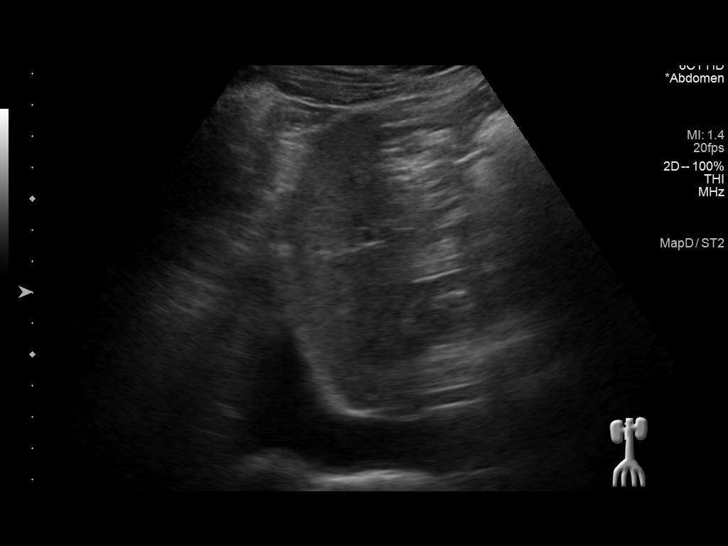
[im 94/173]
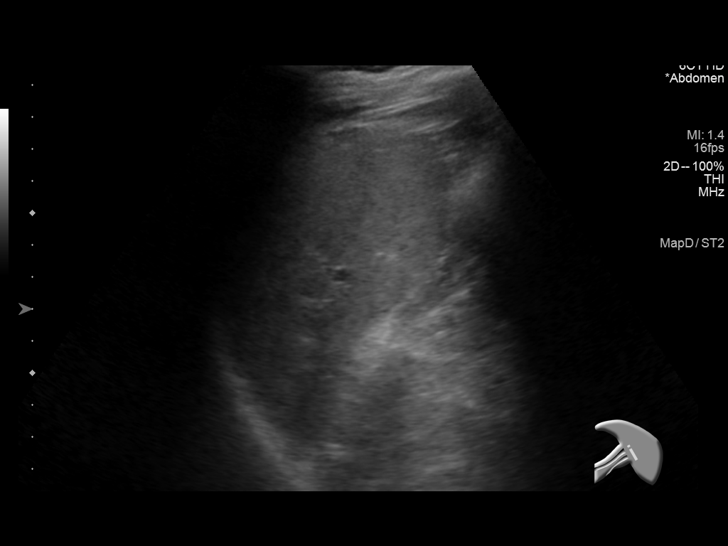
[im 108/173]
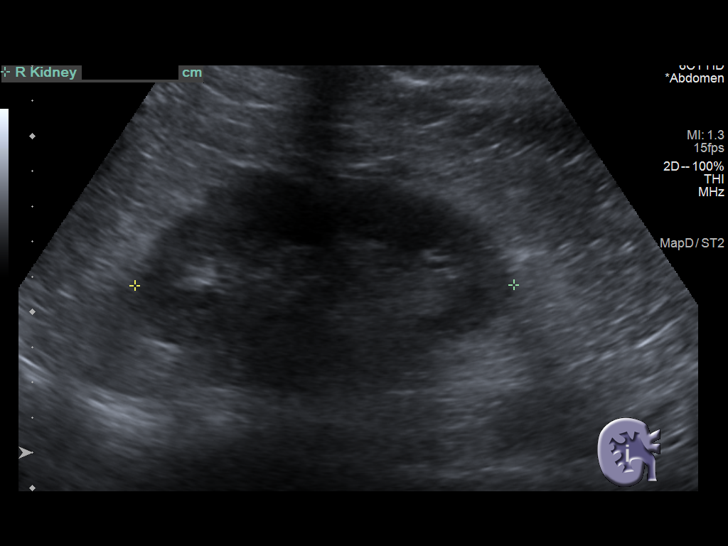
[im 115/173]
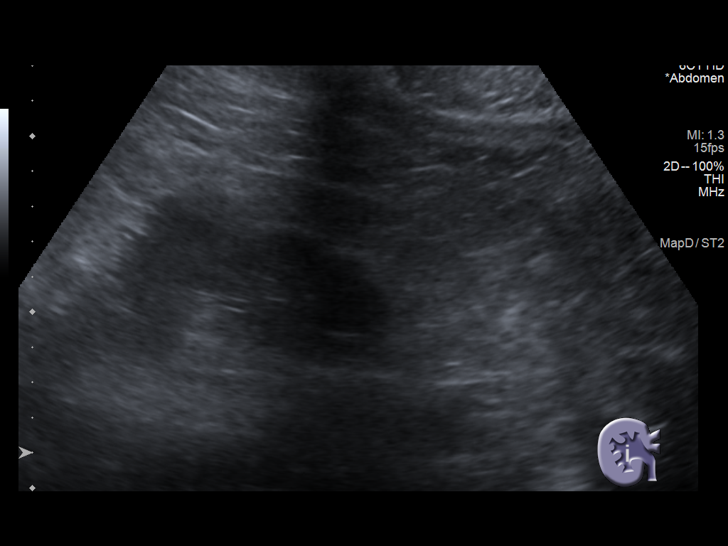
[im 130/173]
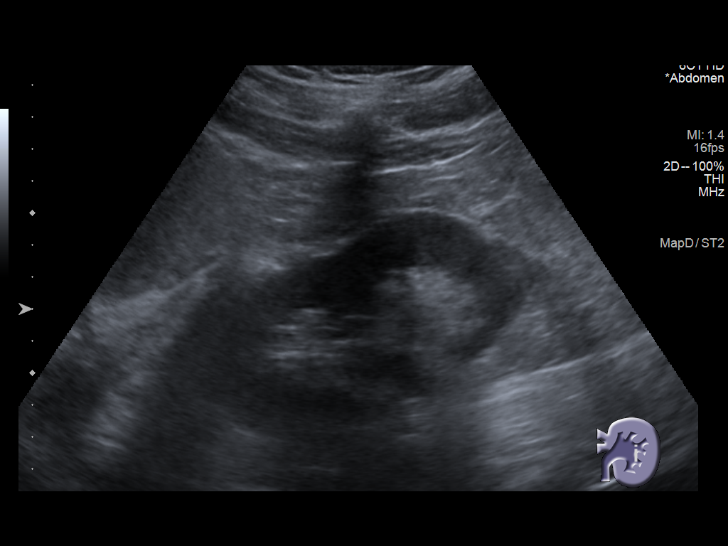
[im 144/173]
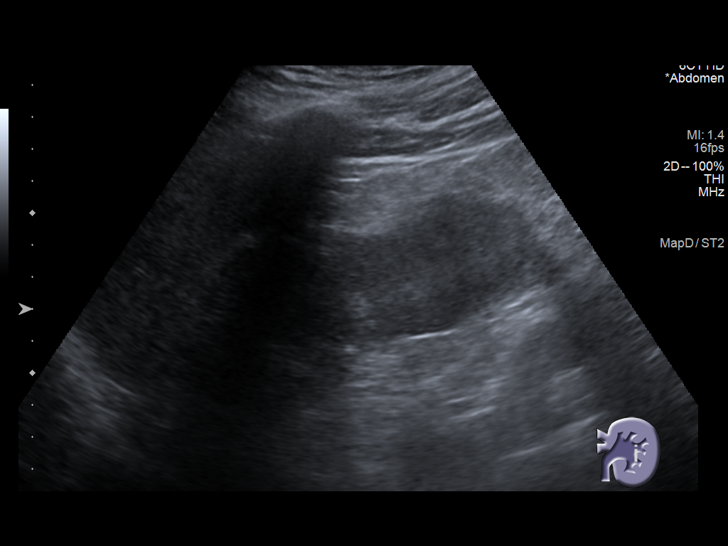
[im 158/173]
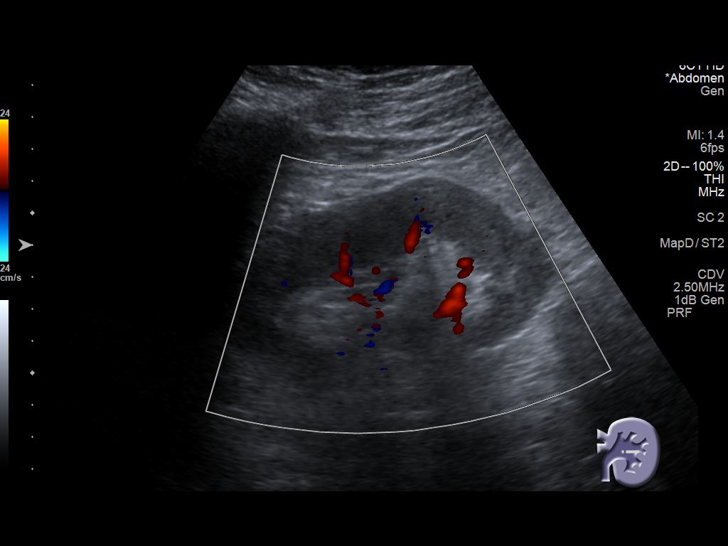
[im 173/173]
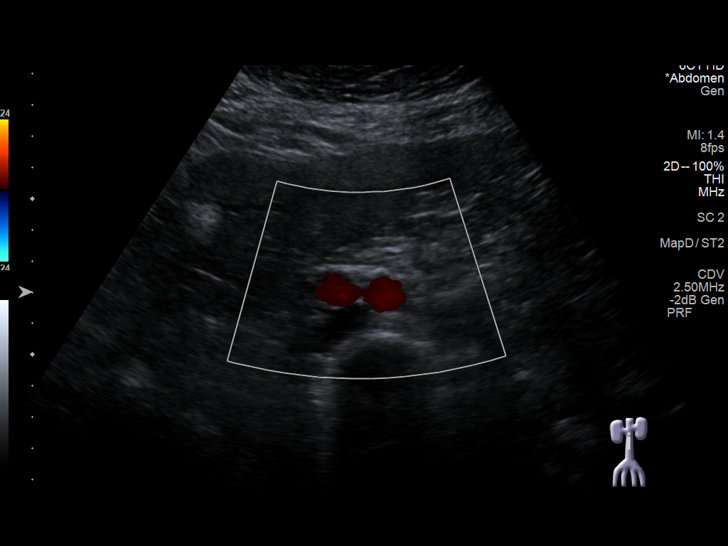

[14 of 25 positions shown; findings below may reference images not displayed]

FINDINGS: Gallbladder: No gallstones or wall thickening visualized. No
sonographic Murphy sign noted by sonographer.

Common bile duct: Diameter: 4 mm

Liver: No focal lesion identified. Within normal limits in
parenchymal echogenicity. Portal vein is patent on color Doppler
imaging with normal direction of blood flow towards the liver.

IVC: No abnormality visualized.

Pancreas: Partially obscured by bowel gas. Visualized portion
unremarkable.

Spleen: Size and appearance within normal limits.

Right Kidney: Length: 10.7 cm. Echogenicity within normal limits. No
mass or hydronephrosis visualized.

Left Kidney: Length: 11.1 cm. Echogenicity within normal limits. No
mass or hydronephrosis visualized.

Abdominal aorta: No aneurysm visualized.

Other findings: None.
IMPRESSION: Unremarkable abdominal ultrasound aside from suboptimal
visualization of the pancreas.

## 2020-10-09 IMAGING — NM NM HEPATO W/GB/PHARM/[PERSON_NAME]
2 series · 12 of 12 positions shown · non-contrast
Comparison: Abdominal ultrasound 06/08/2019

CLINICAL DATA: Nausea vomiting and abdominal pain

EXAM:
NUCLEAR MEDICINE HEPATOBILIARY IMAGING WITH GALLBLADDER EF
TECHNIQUE: Sequential images of the abdomen were obtained [DATE] minutes
following intravenous administration of radiopharmaceutical. After
oral ingestion of Ensure, gallbladder ejection fraction was
determined. At 60 min, normal ejection fraction is greater than 33%.
RADIOPHARMACEUTICALS:  5.1 mCi Oc-GGm  Choletec IV

[Series 1: biliary · 3.25mm/px · 6 of 60 frames shown]
[frame 6/60]
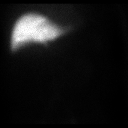
[frame 16/60]
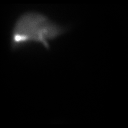
[frame 26/60]
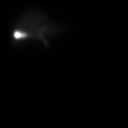
[frame 36/60]
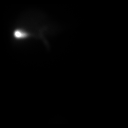
[frame 46/60]
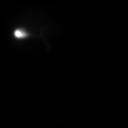
[frame 56/60]
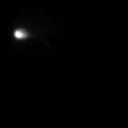

[Series 2: gbef · 3.25mm/px · 6 of 60 frames shown]
[frame 6/60]
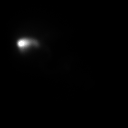
[frame 16/60]
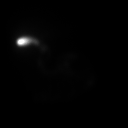
[frame 26/60]
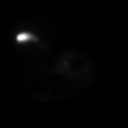
[frame 36/60]
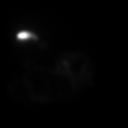
[frame 46/60]
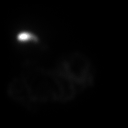
[frame 56/60]
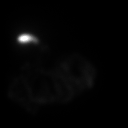

[12 of 12 positions shown; findings below may reference images not displayed]

FINDINGS: Prompt uptake and biliary excretion of activity by the liver is
seen. Gallbladder activity is visualized, consistent with patency of
cystic duct. Slightly delayed biliary to bowel transit, nonspecific
finding, but bowel activity present prior to administration of
Ensure.

Calculated gallbladder ejection fraction is 49%. (Normal gallbladder
ejection fraction with Ensure is greater than 33%.)
IMPRESSION: Negative examination.  Normal gallbladder ejection fraction of 49%.

## 2021-09-17 ENCOUNTER — Ambulatory Visit (INDEPENDENT_AMBULATORY_CARE_PROVIDER_SITE_OTHER): Payer: BC Managed Care – PPO | Admitting: Gastroenterology

## 2022-04-13 ENCOUNTER — Encounter (INDEPENDENT_AMBULATORY_CARE_PROVIDER_SITE_OTHER): Payer: Self-pay | Admitting: Gastroenterology

## 2024-01-11 ENCOUNTER — Encounter (INDEPENDENT_AMBULATORY_CARE_PROVIDER_SITE_OTHER): Payer: Self-pay | Admitting: *Deleted
